# Patient Record
Sex: Female | Born: 1965
Health system: Southern US, Community
[De-identification: ages and names within clinical notes are randomized; demographics above are authoritative.]

## PROBLEM LIST (undated history)

## (undated) DIAGNOSIS — F329 Major depressive disorder, single episode, unspecified: Secondary | ICD-10-CM

## (undated) DIAGNOSIS — Z8619 Personal history of other infectious and parasitic diseases: Secondary | ICD-10-CM

## (undated) DIAGNOSIS — F419 Anxiety disorder, unspecified: Secondary | ICD-10-CM

## (undated) DIAGNOSIS — Z8719 Personal history of other diseases of the digestive system: Secondary | ICD-10-CM

## (undated) DIAGNOSIS — F32A Depression, unspecified: Secondary | ICD-10-CM

## (undated) DIAGNOSIS — E785 Hyperlipidemia, unspecified: Secondary | ICD-10-CM

## (undated) DIAGNOSIS — I10 Essential (primary) hypertension: Secondary | ICD-10-CM

## (undated) DIAGNOSIS — E039 Hypothyroidism, unspecified: Secondary | ICD-10-CM

## (undated) DIAGNOSIS — Z87898 Personal history of other specified conditions: Secondary | ICD-10-CM

## (undated) HISTORY — DX: Personal history of other specified conditions: Z87.898

## (undated) HISTORY — DX: Anxiety disorder, unspecified: F41.9

## (undated) HISTORY — DX: Depression, unspecified: F32.A

## (undated) HISTORY — DX: Major depressive disorder, single episode, unspecified: F32.9

## (undated) HISTORY — DX: Hyperlipidemia, unspecified: E78.5

## (undated) HISTORY — DX: Essential (primary) hypertension: I10

## (undated) HISTORY — DX: Personal history of other infectious and parasitic diseases: Z86.19

## (undated) HISTORY — DX: Hypothyroidism, unspecified: E03.9

## (undated) HISTORY — DX: Personal history of other diseases of the digestive system: Z87.19

---

## 2000-05-14 ENCOUNTER — Other Ambulatory Visit: Admission: RE | Admit: 2000-05-14 | Discharge: 2000-05-14 | Payer: Self-pay | Admitting: Obstetrics and Gynecology

## 2001-03-10 ENCOUNTER — Ambulatory Visit (HOSPITAL_COMMUNITY): Admission: RE | Admit: 2001-03-10 | Discharge: 2001-03-10 | Payer: Self-pay | Admitting: Family Medicine

## 2001-03-14 ENCOUNTER — Encounter: Payer: Self-pay | Admitting: Family Medicine

## 2001-03-14 ENCOUNTER — Ambulatory Visit (HOSPITAL_COMMUNITY): Admission: RE | Admit: 2001-03-14 | Discharge: 2001-03-14 | Payer: Self-pay | Admitting: Family Medicine

## 2001-06-10 ENCOUNTER — Other Ambulatory Visit: Admission: RE | Admit: 2001-06-10 | Discharge: 2001-06-10 | Payer: Self-pay | Admitting: Obstetrics and Gynecology

## 2001-07-11 ENCOUNTER — Encounter: Payer: Self-pay | Admitting: *Deleted

## 2001-07-11 ENCOUNTER — Ambulatory Visit (HOSPITAL_COMMUNITY): Admission: RE | Admit: 2001-07-11 | Discharge: 2001-07-11 | Payer: Self-pay | Admitting: *Deleted

## 2004-10-04 ENCOUNTER — Encounter: Admission: RE | Admit: 2004-10-04 | Discharge: 2004-10-04 | Payer: Self-pay | Admitting: Obstetrics and Gynecology

## 2005-11-01 ENCOUNTER — Encounter: Admission: RE | Admit: 2005-11-01 | Discharge: 2005-11-01 | Payer: Self-pay | Admitting: Obstetrics and Gynecology

## 2006-11-22 ENCOUNTER — Encounter: Admission: RE | Admit: 2006-11-22 | Discharge: 2006-11-22 | Payer: Self-pay | Admitting: Obstetrics and Gynecology

## 2007-12-08 ENCOUNTER — Encounter: Admission: RE | Admit: 2007-12-08 | Discharge: 2007-12-08 | Payer: Self-pay | Admitting: Obstetrics and Gynecology

## 2009-01-04 ENCOUNTER — Encounter: Admission: RE | Admit: 2009-01-04 | Discharge: 2009-01-04 | Payer: Self-pay | Admitting: Obstetrics and Gynecology

## 2010-01-08 HISTORY — PX: ANAL FISSURE REPAIR: SHX2312

## 2010-01-19 ENCOUNTER — Ambulatory Visit (HOSPITAL_BASED_OUTPATIENT_CLINIC_OR_DEPARTMENT_OTHER): Admission: RE | Admit: 2010-01-19 | Discharge: 2010-01-19 | Payer: Self-pay | Admitting: General Surgery

## 2010-11-28 LAB — BASIC METABOLIC PANEL
BUN: 12 mg/dL (ref 6–23)
CO2: 26 mEq/L (ref 19–32)
Calcium: 9.7 mg/dL (ref 8.4–10.5)
Chloride: 102 mEq/L (ref 96–112)
Creatinine, Ser: 0.84 mg/dL (ref 0.4–1.2)
GFR calc Af Amer: >60 mL/min (ref >60)
GFR calc non Af Amer: >60 mL/min (ref >60)
Glucose, Bld: 124 mg/dL — ABNORMAL HIGH (ref 70–99)
Potassium: 4.3 mEq/L (ref 3.5–5.1)
Sodium: 138 mEq/L (ref 135–145)

## 2010-11-28 LAB — CBC
HCT: 38.3 % (ref 36.0–46.0)
Hemoglobin: 12.5 g/dL (ref 12.0–15.0)
MCHC: 32.6 g/dL (ref 30.0–36.0)
MCV: 96 fL (ref 78.0–100.0)
Platelets: 328 10*3/uL (ref 150–400)
RBC: 3.99 MIL/uL (ref 3.87–5.11)
RDW: 15 % (ref 11.5–15.5)
WBC: 10.5 10*3/uL (ref 4.0–10.5)

## 2010-11-28 LAB — DIFFERENTIAL
Basophils Absolute: 0 10*3/uL (ref 0.0–0.1)
Basophils Relative: 0 % (ref 0–1)
Eosinophils Absolute: 0.4 10*3/uL (ref 0.0–0.7)
Eosinophils Relative: 3 % (ref 0–5)
Lymphocytes Relative: 41 % (ref 12–46)
Lymphs Abs: 4.3 10*3/uL — ABNORMAL HIGH (ref 0.7–4.0)
Monocytes Absolute: 0.5 10*3/uL (ref 0.1–1.0)
Monocytes Relative: 4 % (ref 3–12)
Neutro Abs: 5.3 10*3/uL (ref 1.7–7.7)
Neutrophils Relative %: 51 % (ref 43–77)

## 2010-11-28 LAB — POCT PREGNANCY, URINE: Preg Test, Ur: NEGATIVE

## 2010-12-06 ENCOUNTER — Other Ambulatory Visit: Payer: Self-pay | Admitting: Family Medicine

## 2010-12-06 ENCOUNTER — Other Ambulatory Visit (HOSPITAL_COMMUNITY)
Admission: RE | Admit: 2010-12-06 | Discharge: 2010-12-06 | Disposition: A | Discharge status: Home / Self Care | Payer: BC Managed Care – PPO | Source: Ambulatory Visit | Attending: Family Medicine | Admitting: Family Medicine

## 2010-12-06 DIAGNOSIS — Z0000 Encounter for general adult medical examination without abnormal findings: Secondary | ICD-10-CM | POA: Insufficient documentation

## 2011-09-19 ENCOUNTER — Other Ambulatory Visit: Payer: Self-pay | Admitting: Family Medicine

## 2011-09-19 DIAGNOSIS — Z1231 Encounter for screening mammogram for malignant neoplasm of breast: Secondary | ICD-10-CM

## 2011-10-02 ENCOUNTER — Ambulatory Visit
Admission: RE | Admit: 2011-10-02 | Discharge: 2011-10-02 | Disposition: A | Discharge status: Home / Self Care | Payer: BC Managed Care – PPO | Source: Ambulatory Visit | Attending: Family Medicine | Admitting: Family Medicine

## 2011-10-02 DIAGNOSIS — Z1231 Encounter for screening mammogram for malignant neoplasm of breast: Secondary | ICD-10-CM

## 2012-11-27 ENCOUNTER — Other Ambulatory Visit: Payer: Self-pay

## 2012-11-27 DIAGNOSIS — Z1231 Encounter for screening mammogram for malignant neoplasm of breast: Secondary | ICD-10-CM

## 2012-12-08 ENCOUNTER — Ambulatory Visit
Admission: RE | Admit: 2012-12-08 | Discharge: 2012-12-08 | Disposition: A | Discharge status: Home / Self Care | Payer: BC Managed Care – PPO | Source: Ambulatory Visit

## 2012-12-08 DIAGNOSIS — Z1231 Encounter for screening mammogram for malignant neoplasm of breast: Secondary | ICD-10-CM

## 2013-11-18 ENCOUNTER — Other Ambulatory Visit: Payer: Self-pay

## 2013-11-18 DIAGNOSIS — Z1231 Encounter for screening mammogram for malignant neoplasm of breast: Secondary | ICD-10-CM

## 2013-12-15 ENCOUNTER — Ambulatory Visit
Admission: RE | Admit: 2013-12-15 | Discharge: 2013-12-15 | Disposition: A | Discharge status: Home / Self Care | Payer: BC Managed Care – PPO | Source: Ambulatory Visit

## 2013-12-15 DIAGNOSIS — Z1231 Encounter for screening mammogram for malignant neoplasm of breast: Secondary | ICD-10-CM

## 2014-08-26 ENCOUNTER — Other Ambulatory Visit: Payer: Self-pay | Admitting: Family Medicine

## 2014-08-26 ENCOUNTER — Other Ambulatory Visit (HOSPITAL_COMMUNITY)
Admission: RE | Admit: 2014-08-26 | Discharge: 2014-08-26 | Disposition: A | Discharge status: Home / Self Care | Payer: BC Managed Care – PPO | Source: Ambulatory Visit | Attending: Family Medicine | Admitting: Family Medicine

## 2014-08-26 DIAGNOSIS — Z01419 Encounter for gynecological examination (general) (routine) without abnormal findings: Secondary | ICD-10-CM | POA: Insufficient documentation

## 2014-08-27 LAB — CYTOLOGY - PAP

## 2014-10-05 ENCOUNTER — Ambulatory Visit (INDEPENDENT_AMBULATORY_CARE_PROVIDER_SITE_OTHER): Payer: BLUE CROSS/BLUE SHIELD | Admitting: Podiatry

## 2014-10-05 ENCOUNTER — Encounter: Payer: Self-pay | Admitting: Podiatry

## 2014-10-05 VITALS — BP 136/73 | HR 85 | Ht 64.5 in | Wt 173.0 lb

## 2014-10-05 DIAGNOSIS — M21969 Unspecified acquired deformity of unspecified lower leg: Secondary | ICD-10-CM

## 2014-10-05 DIAGNOSIS — M722 Plantar fascial fibromatosis: Secondary | ICD-10-CM

## 2014-10-05 MED ORDER — NABUMETONE 500 MG PO TABS: 500.0000 mg | ORAL_TABLET | Freq: Two times a day (BID) | ORAL | Status: DC

## 2014-10-05 NOTE — Patient Instructions (Signed)
Seen for left heel pain. Injection given. Return for Custom orthotics. Take Relafen as needed.

## 2014-10-05 NOTE — Progress Notes (Signed)
Subjective: 49 year old female presents with a history of plantar fasciitis 10 years ago on left. Now she is getting another episode on her left heel. She has been doing stretch exercise regularly without any success reducing heel pain. Pain is still in bottom of heel. On feet up and down 4-5 hours a day. Her left shoe heel has worn out at lateral side more than the right side.  Objective: Dermatologic: Normal skin without abnormal lesions. Vascular: All pedal pulses are palpable. No edema or erythema noted. Neurologic: All epicritic and tactile sensations grossly intact. Orthopedic: Elevated first ray L>R. Rigid cavus type foot bilateral.  Assessment: Plantar fasciitis left heel. 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  Plan:  Reviewed findings and available options and shoe selections. Continue with stretch exercise.  Left plantar heel injected with mixture of 4 mg Dexamethasone, 4 mg Triamcinolone, and 1 cc of 0.5% Marcaine plain. Patient tolerated well without difficulty.  Rx. Relafen.  Patient will return for custom orthotics.

## 2014-10-07 ENCOUNTER — Telehealth: Payer: Self-pay | Admitting: *Deleted

## 2014-10-07 NOTE — Telephone Encounter (Signed)
10/07/14 Audrey Rogers, Patient called today. She ask can we send a note to her Audrey Rogers,Audrey Rogers saying she needs to wear tennis shoes for her foot issues. Dr. Raynald KempSheard told her she needed to wear them. You can fax the note to [phone]

## 2014-10-07 NOTE — Telephone Encounter (Signed)
10/07/14 Dr. Raynald KempSheard, Patient called this am. She was in the office on 10/05/14 and was given a cortisone injection, several hours later she got real hot in her face and redness and after she got home redness some on her chest too. Just a FYI. Thanks

## 2014-10-12 ENCOUNTER — Encounter: Payer: Self-pay | Admitting: Podiatry

## 2014-10-12 ENCOUNTER — Ambulatory Visit (INDEPENDENT_AMBULATORY_CARE_PROVIDER_SITE_OTHER): Payer: BLUE CROSS/BLUE SHIELD | Admitting: Podiatry

## 2014-10-12 VITALS — BP 151/86 | HR 97 | Ht 64.5 in | Wt 172.0 lb

## 2014-10-12 DIAGNOSIS — M722 Plantar fascial fibromatosis: Secondary | ICD-10-CM

## 2014-10-12 DIAGNOSIS — M21969 Unspecified acquired deformity of unspecified lower leg: Secondary | ICD-10-CM

## 2014-10-12 NOTE — Patient Instructions (Signed)
Both feet casted for orthotics. Continue with stretch exercise.  

## 2014-10-12 NOTE — Progress Notes (Signed)
Subjective: 49 year old female presents to prepare for custom orthotics.  After she had cortisone injection in left heel she took Relafen that night. The next day she got red hot flesh on her face. She discontinued Relafen and resumed Aleve.  Pain is not as bad as before the injection, but still has some residual pain in bottom of left heel.  Objective: Dermatologic: Normal skin without abnormal lesions. Vascular: All pedal pulses are palpable. No edema or erythema noted. Neurologic: All epicritic and tactile sensations grossly intact. Orthopedic: Elevated first ray L>R. Rigid cavus type foot bilateral. Radiographic examination reveal cavus type foot with elevated first ray bilateral, positive for plantar calcaneal spur on left. Minimum change in lateral deviation angle of Calcaneocuboid angle bilateral.  Assessment: Plantar fasciitis left heel. 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  Plan:  Continue with stretch exercise.  Both feet casted for orthotics.

## 2014-10-15 DIAGNOSIS — Z8719 Personal history of other diseases of the digestive system: Secondary | ICD-10-CM

## 2014-10-15 HISTORY — PX: COLONOSCOPY: SHX174

## 2014-10-15 HISTORY — DX: Personal history of other diseases of the digestive system: Z87.19

## 2014-12-01 ENCOUNTER — Encounter: Payer: Self-pay | Admitting: Podiatry

## 2014-12-01 ENCOUNTER — Ambulatory Visit (INDEPENDENT_AMBULATORY_CARE_PROVIDER_SITE_OTHER): Payer: BLUE CROSS/BLUE SHIELD | Admitting: Podiatry

## 2014-12-01 DIAGNOSIS — M722 Plantar fascial fibromatosis: Secondary | ICD-10-CM

## 2014-12-01 NOTE — Progress Notes (Signed)
Pain with orthotics. Got more throbbing pain at the end of the day. On feet a lot at work.  Last injection did not help much. Pain is at center of left plantar heel.  Dx. Plantar fasciitis left. Elevated first ray bilateral.  Plan: Will adjust orthotics; take off heel lift and add more cushion.

## 2014-12-01 NOTE — Patient Instructions (Signed)
Pain with orthotics. Will adjust. Take off lift and add more cushion on heel.

## 2015-03-01 LAB — TSH: TSH: 2.77 (ref 0.41–5.90)

## 2015-11-28 LAB — TSH: TSH: 4.11 (ref 0.41–5.90)

## 2016-01-25 DIAGNOSIS — R946 Abnormal results of thyroid function studies: Secondary | ICD-10-CM | POA: Diagnosis not present

## 2016-05-31 DIAGNOSIS — R030 Elevated blood-pressure reading, without diagnosis of hypertension: Secondary | ICD-10-CM | POA: Diagnosis not present

## 2016-05-31 DIAGNOSIS — E782 Mixed hyperlipidemia: Secondary | ICD-10-CM | POA: Diagnosis not present

## 2016-05-31 DIAGNOSIS — F419 Anxiety disorder, unspecified: Secondary | ICD-10-CM | POA: Diagnosis not present

## 2016-05-31 DIAGNOSIS — E039 Hypothyroidism, unspecified: Secondary | ICD-10-CM | POA: Diagnosis not present

## 2016-05-31 DIAGNOSIS — Z23 Encounter for immunization: Secondary | ICD-10-CM | POA: Diagnosis not present

## 2016-05-31 LAB — BASIC METABOLIC PANEL
BUN: 14 (ref 4–21)
Creatinine: 0.7 (ref 0.5–1.1)
Glucose: 138
Potassium: 4.3 (ref 3.4–5.3)
Sodium: 138 (ref 137–147)

## 2016-05-31 LAB — LIPID PANEL
Cholesterol: 198 (ref 0–200)
HDL: 54 (ref 35–70)
LDL Cholesterol: 100
LDl/HDL Ratio: 3.7
Triglycerides: 218 — AB (ref 40–160)

## 2016-05-31 LAB — HEPATIC FUNCTION PANEL
ALT: 13 (ref 7–35)
AST: 15 (ref 13–35)
Alkaline Phosphatase: 1.4 — AB (ref 25–125)
Bilirubin, Total: 0.3

## 2016-11-14 DIAGNOSIS — S161XXA Strain of muscle, fascia and tendon at neck level, initial encounter: Secondary | ICD-10-CM | POA: Diagnosis not present

## 2016-11-14 DIAGNOSIS — S39012A Strain of muscle, fascia and tendon of lower back, initial encounter: Secondary | ICD-10-CM | POA: Diagnosis not present

## 2016-11-14 DIAGNOSIS — S0003XA Contusion of scalp, initial encounter: Secondary | ICD-10-CM | POA: Diagnosis not present

## 2016-11-26 DIAGNOSIS — R030 Elevated blood-pressure reading, without diagnosis of hypertension: Secondary | ICD-10-CM | POA: Diagnosis not present

## 2016-11-26 DIAGNOSIS — S93402A Sprain of unspecified ligament of left ankle, initial encounter: Secondary | ICD-10-CM | POA: Diagnosis not present

## 2016-12-10 DIAGNOSIS — M25572 Pain in left ankle and joints of left foot: Secondary | ICD-10-CM | POA: Diagnosis not present

## 2016-12-27 DIAGNOSIS — M25572 Pain in left ankle and joints of left foot: Secondary | ICD-10-CM | POA: Diagnosis not present

## 2017-01-02 DIAGNOSIS — E782 Mixed hyperlipidemia: Secondary | ICD-10-CM | POA: Diagnosis not present

## 2017-01-02 DIAGNOSIS — E039 Hypothyroidism, unspecified: Secondary | ICD-10-CM | POA: Diagnosis not present

## 2017-01-02 DIAGNOSIS — Z0000 Encounter for general adult medical examination without abnormal findings: Secondary | ICD-10-CM | POA: Diagnosis not present

## 2017-01-03 LAB — CBC AND DIFFERENTIAL
HCT: 37 (ref 36–46)
Hemoglobin: 12.7 (ref 12.0–16.0)
Platelets: 302 (ref 150–399)
WBC: 10.1

## 2017-01-03 LAB — HEPATIC FUNCTION PANEL
ALT: 14 (ref 7–35)
AST: 18 (ref 13–35)
Alkaline Phosphatase: 108 (ref 25–125)
Bilirubin, Total: 0.4

## 2017-01-03 LAB — BASIC METABOLIC PANEL
BUN: 14 (ref 4–21)
Creatinine: 0.7 (ref 0.5–1.1)
Glucose: 89
Sodium: 138 (ref 137–147)

## 2017-01-03 LAB — TSH: TSH: 2.22 (ref 0.41–5.90)

## 2017-01-03 LAB — LIPID PANEL
Cholesterol: 230 — AB (ref 0–200)
HDL: 51 (ref 35–70)
LDL Cholesterol: 143
LDl/HDL Ratio: 4.5
Triglycerides: 180 — AB (ref 40–160)

## 2017-02-19 ENCOUNTER — Other Ambulatory Visit: Payer: Self-pay | Admitting: Family Medicine

## 2017-02-19 DIAGNOSIS — Z1231 Encounter for screening mammogram for malignant neoplasm of breast: Secondary | ICD-10-CM

## 2017-02-21 ENCOUNTER — Ambulatory Visit
Admission: RE | Admit: 2017-02-21 | Discharge: 2017-02-21 | Disposition: A | Discharge status: Home / Self Care | Payer: BLUE CROSS/BLUE SHIELD | Source: Ambulatory Visit | Attending: Family Medicine | Admitting: Family Medicine

## 2017-02-21 DIAGNOSIS — Z1231 Encounter for screening mammogram for malignant neoplasm of breast: Secondary | ICD-10-CM

## 2017-03-19 DIAGNOSIS — H524 Presbyopia: Secondary | ICD-10-CM | POA: Diagnosis not present

## 2017-07-04 DIAGNOSIS — F419 Anxiety disorder, unspecified: Secondary | ICD-10-CM | POA: Diagnosis not present

## 2017-07-04 DIAGNOSIS — E782 Mixed hyperlipidemia: Secondary | ICD-10-CM | POA: Diagnosis not present

## 2017-07-04 DIAGNOSIS — E039 Hypothyroidism, unspecified: Secondary | ICD-10-CM | POA: Diagnosis not present

## 2017-07-04 DIAGNOSIS — Z23 Encounter for immunization: Secondary | ICD-10-CM | POA: Diagnosis not present

## 2018-01-09 ENCOUNTER — Other Ambulatory Visit (HOSPITAL_COMMUNITY)
Admission: RE | Admit: 2018-01-09 | Discharge: 2018-01-09 | Disposition: A | Discharge status: Home / Self Care | Payer: BLUE CROSS/BLUE SHIELD | Source: Ambulatory Visit | Attending: Family Medicine | Admitting: Family Medicine

## 2018-01-09 ENCOUNTER — Other Ambulatory Visit: Payer: Self-pay | Admitting: Family Medicine

## 2018-01-09 DIAGNOSIS — E782 Mixed hyperlipidemia: Secondary | ICD-10-CM | POA: Diagnosis not present

## 2018-01-09 DIAGNOSIS — Z124 Encounter for screening for malignant neoplasm of cervix: Secondary | ICD-10-CM | POA: Insufficient documentation

## 2018-01-09 DIAGNOSIS — F419 Anxiety disorder, unspecified: Secondary | ICD-10-CM | POA: Diagnosis not present

## 2018-01-09 DIAGNOSIS — Z0000 Encounter for general adult medical examination without abnormal findings: Secondary | ICD-10-CM | POA: Diagnosis not present

## 2018-01-09 DIAGNOSIS — E039 Hypothyroidism, unspecified: Secondary | ICD-10-CM | POA: Diagnosis not present

## 2018-01-09 LAB — HEPATIC FUNCTION PANEL
ALT: 11 (ref 7–35)
AST: 13 (ref 13–35)
Alkaline Phosphatase: 82 (ref 25–125)
Bilirubin, Total: 0.4

## 2018-01-09 LAB — BASIC METABOLIC PANEL
BUN: 17 (ref 4–21)
Creatinine: 0.7 (ref 0.5–1.1)
Glucose: 89
Potassium: 4.2 (ref 3.4–5.3)
Sodium: 138 (ref 137–147)

## 2018-01-09 LAB — CBC AND DIFFERENTIAL
HCT: 38 (ref 36–46)
Hemoglobin: 12.7 (ref 12.0–16.0)
Platelets: 287 (ref 150–399)
WBC: 8.8

## 2018-01-09 LAB — LIPID PANEL
Cholesterol: 212 — AB (ref 0–200)
HDL: 54 (ref 35–70)
LDL Cholesterol: 127
LDl/HDL Ratio: 3.9
Triglycerides: 153 (ref 40–160)

## 2018-01-09 LAB — TSH: TSH: 4.76 (ref 0.41–5.90)

## 2018-01-10 LAB — CYTOLOGY - PAP
Diagnosis: NEGATIVE
HPV: NOT DETECTED

## 2018-03-07 DIAGNOSIS — E039 Hypothyroidism, unspecified: Secondary | ICD-10-CM | POA: Diagnosis not present

## 2018-05-26 DIAGNOSIS — Z23 Encounter for immunization: Secondary | ICD-10-CM | POA: Diagnosis not present

## 2018-06-05 ENCOUNTER — Ambulatory Visit: Payer: BLUE CROSS/BLUE SHIELD | Admitting: Family Medicine

## 2018-06-18 ENCOUNTER — Ambulatory Visit: Payer: BLUE CROSS/BLUE SHIELD | Admitting: Family Medicine

## 2018-06-18 ENCOUNTER — Encounter: Payer: Self-pay | Admitting: Family Medicine

## 2018-06-18 VITALS — BP 116/78 | HR 84 | Temp 97.9°F | Ht 65.0 in | Wt 190.8 lb

## 2018-06-18 DIAGNOSIS — E039 Hypothyroidism, unspecified: Secondary | ICD-10-CM | POA: Diagnosis not present

## 2018-06-18 DIAGNOSIS — R0683 Snoring: Secondary | ICD-10-CM | POA: Insufficient documentation

## 2018-06-18 DIAGNOSIS — G4700 Insomnia, unspecified: Secondary | ICD-10-CM | POA: Diagnosis not present

## 2018-06-18 DIAGNOSIS — F329 Major depressive disorder, single episode, unspecified: Secondary | ICD-10-CM

## 2018-06-18 DIAGNOSIS — E785 Hyperlipidemia, unspecified: Secondary | ICD-10-CM | POA: Diagnosis not present

## 2018-06-18 DIAGNOSIS — F419 Anxiety disorder, unspecified: Secondary | ICD-10-CM

## 2018-06-18 DIAGNOSIS — F32A Depression, unspecified: Secondary | ICD-10-CM | POA: Insufficient documentation

## 2018-06-18 LAB — LIPID PANEL
Cholesterol: 210 mg/dL — ABNORMAL HIGH (ref 0–200)
HDL: 53.2 mg/dL (ref >39.00)
LDL Cholesterol: 123 mg/dL — ABNORMAL HIGH (ref 0–99)
NonHDL: 156.61
Total CHOL/HDL Ratio: 4
Triglycerides: 170 mg/dL — ABNORMAL HIGH (ref 0.0–149.0)
VLDL: 34 mg/dL (ref 0.0–40.0)

## 2018-06-18 LAB — COMPREHENSIVE METABOLIC PANEL
ALT: 16 U/L (ref 0–35)
AST: 15 U/L (ref 0–37)
Albumin: 4.4 g/dL (ref 3.5–5.2)
Alkaline Phosphatase: 79 U/L (ref 39–117)
BUN: 16 mg/dL (ref 6–23)
CO2: 30 mEq/L (ref 19–32)
Calcium: 10 mg/dL (ref 8.4–10.5)
Chloride: 103 mEq/L (ref 96–112)
Creatinine, Ser: 0.72 mg/dL (ref 0.40–1.20)
GFR: 89.75 mL/min (ref >60.00)
Glucose, Bld: 79 mg/dL (ref 70–99)
Potassium: 4.3 mEq/L (ref 3.5–5.1)
Sodium: 138 mEq/L (ref 135–145)
Total Bilirubin: 0.3 mg/dL (ref 0.2–1.2)
Total Protein: 7.9 g/dL (ref 6.0–8.3)

## 2018-06-18 LAB — T4, FREE: Free T4: 0.86 ng/dL (ref 0.60–1.60)

## 2018-06-18 LAB — TSH: TSH: 1.53 u[IU]/mL (ref 0.35–4.50)

## 2018-06-18 MED ORDER — TRAZODONE HCL 50 MG PO TABS: 25.0000 mg | ORAL_TABLET | Freq: Every evening | ORAL | 3 refills | Status: AC | PRN

## 2018-06-18 NOTE — Assessment & Plan Note (Addendum)
>  30 min spent with patient, at least half of which was spent on counseling insomnia.  The problem of recurrent insomnia is discussed. Avoidance of caffeine sources is strongly encouraged. Sleep hygiene issues are reviewed.  We are starting trazodone 25- 50 mg nightly. She will update me in a few weeks.

## 2018-06-18 NOTE — Progress Notes (Addendum)
Subjective:   Patient ID: Audrey Rogers, female    DOB: [birth year], 52 y.o.   MRN: [id number]  DONNAMAE MUILENBURG is a pleasant 52 y.o. year old female who presents to clinic today with New Patient (Initial Visit) (Patient is here today to establish care.  She is not currently fasting.  She had flu shot at [employer].  She requested her records to be sent to [place] from her previous provider.  She takes Synthroid , Sertraline 50mg , and Alprazolam 0.5mg .  She is compliant with current regimen but feels that she would like to discuss the Sertraline.  )  on 06/18/2018  HPI:  Establishing care- new to me.  Hypothyroidism- currently taking synthroid 88 mcg daily. Denies any symptoms of hypo or hyperthyroidism. No results found for: TSH  Anxiety/depression- Takes zoloft 50 mg daily with as needed xanax for the past 5 years.  Rarely takes xanax.  Insomnia- has deteriorated recently.  Feeling more overwhelmed at work. Tylenol PM has helped but not much.  Has been told she is snoring and gasping for breath lately.  Current Outpatient Medications on File Prior to Visit  Medication Sig Dispense Refill  . ALPRAZolam (XANAX) 0.5 MG tablet   0  . sertraline (ZOLOFT) 50 MG tablet   0  . SYNTHROID 88 MCG tablet Take 1 tablet by mouth daily.  1   No current facility-administered medications on file prior to visit.     Allergies  Allergen Reactions  . Sulfa Antibiotics     No past medical history on file.  No past surgical history on file.  No family history on file.  Social History   Socioeconomic History  . Marital status: Married    Spouse name: Not on file  . Number of children: Not on file  . Years of education: Not on file  . Highest education level: Not on file  Occupational History  . Not on file  Social Needs  . Financial resource strain: Not on file  . Food insecurity:    Worry: Not on file    Inability: Not on file  . Transportation needs:    Medical: Not  on file    Non-medical: Not on file  Tobacco Use  . Smoking status: Never Smoker  . Smokeless tobacco: Never Used  Substance and Sexual Activity  . Alcohol use: Not on file  . Drug use: Not on file  . Sexual activity: Not on file  Lifestyle  . Physical activity:    Days per week: Not on file    Minutes per session: Not on file  . Stress: Not on file  Relationships  . Social connections:    Talks on phone: Not on file    Gets together: Not on file    Attends religious service: Not on file    Active member of club or organization: Not on file    Attends meetings of clubs or organizations: Not on file    Relationship status: Not on file  . Intimate partner violence:    Fear of current or ex partner: Not on file    Emotionally abused: Not on file    Physically abused: Not on file    Forced sexual activity: Not on file  Other Topics Concern  . Not on file  Social History Narrative  . Not on file   The PMH, PSH, Social History, Family History, Medications, and allergies have been reviewed in [facility], and have been updated if relevant.  Current Outpatient Medications on File Prior to Visit  Medication Sig Dispense Refill  . ALPRAZolam (XANAX) 0.5 MG tablet   0  . sertraline (ZOLOFT) 50 MG tablet   0  . SYNTHROID 88 MCG tablet Take 1 tablet by mouth daily.  1   No current facility-administered medications on file prior to visit.     Allergies  Allergen Reactions  . Sulfa Antibiotics     No past medical history on file.  No past surgical history on file.  No family history on file.  Social History   Socioeconomic History  . Marital status: Married    Spouse name: Not on file  . Number of children: Not on file  . Years of education: Not on file  . Highest education level: Not on file  Occupational History  . Not on file  Social Needs  . Financial resource strain: Not on file  . Food insecurity:    Worry: Not on file    Inability: Not on file  . Transportation  needs:    Medical: Not on file    Non-medical: Not on file  Tobacco Use  . Smoking status: Never Smoker  . Smokeless tobacco: Never Used  Substance and Sexual Activity  . Alcohol use: Not on file  . Drug use: Not on file  . Sexual activity: Not on file  Lifestyle  . Physical activity:    Days per week: Not on file    Minutes per session: Not on file  . Stress: Not on file  Relationships  . Social connections:    Talks on phone: Not on file    Gets together: Not on file    Attends religious service: Not on file    Active member of club or organization: Not on file    Attends meetings of clubs or organizations: Not on file    Relationship status: Not on file  . Intimate partner violence:    Fear of current or ex partner: Not on file    Emotionally abused: Not on file    Physically abused: Not on file    Forced sexual activity: Not on file  Other Topics Concern  . Not on file  Social History Narrative  . Not on file   The PMH, PSH, Social History, Family History, Medications, and allergies have been reviewed in [facility], and have been updated if relevant.   Review of Systems  Constitutional: Positive for fatigue.  HENT: Negative.   Eyes: Negative.   Respiratory: Negative.   Cardiovascular: Negative.   Gastrointestinal: Negative.   Endocrine: Negative.   Genitourinary: Negative.   Musculoskeletal: Negative.   Skin: Negative.   Allergic/Immunologic: Negative.   Neurological: Negative.   Hematological: Negative.   Psychiatric/Behavioral: Positive for sleep disturbance. Negative for agitation, behavioral problems, confusion, decreased concentration, dysphoric mood, hallucinations, self-injury and suicidal ideas. The patient is nervous/anxious. The patient is not hyperactive.   All other systems reviewed and are negative.      Objective:    BP 116/78 (BP Location: Left Arm, Patient Position: Sitting, Cuff Size: Normal)   Pulse 84   Temp 97.9 F (36.6 C) (Oral)   Ht 5'  5" (1.651 m)   Wt 190 lb 12.8 oz (86.5 kg)   SpO2 97%   BMI 31.75 kg/m    Physical Exam  Constitutional: She is oriented to person, place, and time. She appears well-developed and well-nourished. No distress.  HENT:  Head: Normocephalic and atraumatic.  Eyes: EOM are  normal.  Neck: Normal range of motion.  Cardiovascular: Normal rate and regular rhythm.  Pulmonary/Chest: Effort normal and breath sounds normal.  Abdominal: Soft. Bowel sounds are normal.  Musculoskeletal: Normal range of motion.  Neurological: She is alert and oriented to person, place, and time. No cranial nerve deficit.  Skin: Skin is warm and dry. She is not diaphoretic.  Psychiatric: She has a normal mood and affect. Her behavior is normal. Judgment and thought content normal.  Nursing note and vitals reviewed.         Assessment & Plan:   Anxiety and depression  Hypothyroidism, unspecified type No follow-ups on file.

## 2018-06-18 NOTE — Assessment & Plan Note (Addendum)
Increased fatigue- recheck thyroid panel today. Continue synthroid at current dose.

## 2018-06-18 NOTE — Patient Instructions (Signed)
Great to see you. I will call you with your lab results from today and you can view them online.   We are starting Trazodone 25- 50 mg nightly for insomnia.  We are referring you to a lung doctor for a sleep study.

## 2018-06-18 NOTE — Assessment & Plan Note (Signed)
?   Sleep apnea. Refer to pulmonary for sleep study.

## 2018-06-18 NOTE — Assessment & Plan Note (Signed)
Repeat lipid panel today. 

## 2018-06-18 NOTE — Assessment & Plan Note (Signed)
Continue current dose of zoloft and as needed xanax for now.

## 2018-06-30 ENCOUNTER — Other Ambulatory Visit: Payer: Self-pay | Admitting: Family Medicine

## 2018-06-30 DIAGNOSIS — Z1231 Encounter for screening mammogram for malignant neoplasm of breast: Secondary | ICD-10-CM

## 2018-07-10 ENCOUNTER — Ambulatory Visit: Payer: BLUE CROSS/BLUE SHIELD | Admitting: Nurse Practitioner

## 2018-07-10 ENCOUNTER — Encounter: Payer: Self-pay | Admitting: Nurse Practitioner

## 2018-07-10 VITALS — BP 132/84 | HR 93 | Temp 97.8°F | Ht 65.0 in | Wt 192.0 lb

## 2018-07-10 DIAGNOSIS — J040 Acute laryngitis: Secondary | ICD-10-CM

## 2018-07-10 DIAGNOSIS — J209 Acute bronchitis, unspecified: Secondary | ICD-10-CM | POA: Diagnosis not present

## 2018-07-10 DIAGNOSIS — J0100 Acute maxillary sinusitis, unspecified: Secondary | ICD-10-CM

## 2018-07-10 MED ORDER — AZITHROMYCIN 250 MG PO TABS: 250.0000 mg | ORAL_TABLET | Freq: Every day | ORAL | 0 refills | Status: DC

## 2018-07-10 MED ORDER — METHYLPREDNISOLONE ACETATE 40 MG/ML IJ SUSP
40.0000 mg | Freq: Once | INTRAMUSCULAR | Status: AC
Start: 2018-07-10 — End: 2018-07-10
  Administered 2018-07-10: 40 mg via INTRAMUSCULAR

## 2018-07-10 MED ORDER — GUAIFENESIN-DM 100-10 MG/5ML PO SYRP: 5.0000 mL | ORAL_SOLUTION | ORAL | 0 refills | Status: DC | PRN

## 2018-07-10 MED ORDER — ALBUTEROL SULFATE HFA 108 (90 BASE) MCG/ACT IN AERS: 1.0000 | INHALATION_SPRAY | Freq: Four times a day (QID) | RESPIRATORY_TRACT | 0 refills | Status: DC | PRN

## 2018-07-10 MED ORDER — HYDROCODONE-HOMATROPINE 5-1.5 MG/5ML PO SYRP: 5.0000 mL | ORAL_SOLUTION | Freq: Three times a day (TID) | ORAL | 0 refills | Status: DC | PRN

## 2018-07-10 NOTE — Progress Notes (Signed)
Subjective:  Patient ID: Audrey Rogers, female    DOB: Aug 16, 1966  Age: 52 y.o. MRN: [id number]  CC: Cough (patient is complaining of coughing thick mucus,unable to sleep,painful on left side going toward the back,tingling on fingers left side. going on 1 wk. took advil cold and sinus and robitussion CF. )  Cough  This is a new problem. The current episode started in the past 7 days. The problem has been unchanged. The problem occurs constantly. The cough is non-productive. Associated symptoms include chest pain, myalgias, nasal congestion, postnasal drip and shortness of breath. Pertinent negatives include no chills, fever, headaches, heartburn, rash, rhinorrhea, sore throat, sweats or wheezing. The symptoms are aggravated by lying down. She has tried OTC cough suppressant and rest for the symptoms. The treatment provided no relief.   Reviewed past Medical, Social and Family history today.  Outpatient Medications Prior to Visit  Medication Sig Dispense Refill  . ALPRAZolam (XANAX) 0.5 MG tablet   0  . sertraline (ZOLOFT) 50 MG tablet   0  . SYNTHROID 88 MCG tablet Take 1 tablet by mouth daily.  1  . traZODone (DESYREL) 50 MG tablet Take 0.5-1 tablets (25-50 mg total) by mouth at bedtime as needed for sleep. 30 tablet 3   No facility-administered medications prior to visit.     ROS See HPI  Objective:  BP 132/84   Pulse 93   Temp 97.8 F (36.6 C) (Oral)   Ht 5\' 5"  (1.651 m)   Wt 192 lb (87.1 kg)   SpO2 98%   BMI 31.95 kg/m   BP Readings from Last 3 Encounters:  07/10/18 132/84  06/18/18 116/78  10/12/14 (!) 151/86    Wt Readings from Last 3 Encounters:  07/10/18 192 lb (87.1 kg)  06/18/18 190 lb 12.8 oz (86.5 kg)  10/12/14 172 lb (78 kg)    Physical Exam  Constitutional: She is oriented to person, place, and time.  HENT:  Right Ear: Tympanic membrane, external ear and ear canal normal.  Left Ear: Tympanic membrane, external ear and ear canal normal.  Nose:  Mucosal edema and rhinorrhea present. Right sinus exhibits maxillary sinus tenderness. Right sinus exhibits no frontal sinus tenderness. Left sinus exhibits maxillary sinus tenderness. Left sinus exhibits no frontal sinus tenderness.  Mouth/Throat: Uvula is midline. No trismus in the jaw. Posterior oropharyngeal erythema present. No oropharyngeal exudate, posterior oropharyngeal edema or tonsillar abscesses. Tonsils are 1+ on the right. Tonsils are 1+ on the left.  Eyes: No scleral icterus.  Neck: Normal range of motion. Neck supple.  Cardiovascular: Normal rate and normal heart sounds.  Pulmonary/Chest: Effort normal and breath sounds normal. She has no wheezes. She exhibits no tenderness.  Musculoskeletal: She exhibits no edema.  Lymphadenopathy:    She has no cervical adenopathy.  Neurological: She is alert and oriented to person, place, and time.  Vitals reviewed.   Lab Results  Component Value Date   WBC 10.5 01/16/2010   HGB 12.5 01/16/2010   HCT 38.3 01/16/2010   PLT 328 01/16/2010   GLUCOSE 79 06/18/2018   CHOL 210 (H) 06/18/2018   TRIG 170.0 (H) 06/18/2018   HDL 53.20 06/18/2018   LDLCALC 123 (H) 06/18/2018   ALT 16 06/18/2018   AST 15 06/18/2018   NA 138 06/18/2018   K 4.3 06/18/2018   CL 103 06/18/2018   CREATININE 0.72 06/18/2018   BUN 16 06/18/2018   CO2 30 06/18/2018   TSH 1.53 06/18/2018    Assessment &  Plan:   Lorana was seen today for cough.  Diagnoses and all orders for this visit:  Acute non-recurrent maxillary sinusitis -     methylPREDNISolone acetate (DEPO-MEDROL) injection 40 mg -     guaiFENesin-dextromethorphan (ROBITUSSIN DM) 100-10 MG/5ML syrup; Take 5 mLs by mouth every 4 (four) hours as needed for cough. -     azithromycin (ZITHROMAX Z-PAK) 250 MG tablet; Take 1 tablet (250 mg total) by mouth daily. Take 2tabs on first day, then 1tab once a day till complete -     HYDROcodone-homatropine (HYCODAN) 5-1.5 MG/5ML syrup; Take 5 mLs by mouth every 8  (eight) hours as needed for cough. -     albuterol (PROVENTIL HFA;VENTOLIN HFA) 108 (90 Base) MCG/ACT inhaler; Inhale 1-2 puffs into the lungs every 6 (six) hours as needed.  Acute bronchitis, unspecified organism -     methylPREDNISolone acetate (DEPO-MEDROL) injection 40 mg -     guaiFENesin-dextromethorphan (ROBITUSSIN DM) 100-10 MG/5ML syrup; Take 5 mLs by mouth every 4 (four) hours as needed for cough. -     azithromycin (ZITHROMAX Z-PAK) 250 MG tablet; Take 1 tablet (250 mg total) by mouth daily. Take 2tabs on first day, then 1tab once a day till complete -     HYDROcodone-homatropine (HYCODAN) 5-1.5 MG/5ML syrup; Take 5 mLs by mouth every 8 (eight) hours as needed for cough. -     albuterol (PROVENTIL HFA;VENTOLIN HFA) 108 (90 Base) MCG/ACT inhaler; Inhale 1-2 puffs into the lungs every 6 (six) hours as needed.  Acute laryngitis -     methylPREDNISolone acetate (DEPO-MEDROL) injection 40 mg -     guaiFENesin-dextromethorphan (ROBITUSSIN DM) 100-10 MG/5ML syrup; Take 5 mLs by mouth every 4 (four) hours as needed for cough. -     azithromycin (ZITHROMAX Z-PAK) 250 MG tablet; Take 1 tablet (250 mg total) by mouth daily. Take 2tabs on first day, then 1tab once a day till complete -     HYDROcodone-homatropine (HYCODAN) 5-1.5 MG/5ML syrup; Take 5 mLs by mouth every 8 (eight) hours as needed for cough. -     albuterol (PROVENTIL HFA;VENTOLIN HFA) 108 (90 Base) MCG/ACT inhaler; Inhale 1-2 puffs into the lungs every 6 (six) hours as needed.   I am having Daley L. Lalla Brothers start on guaiFENesin-dextromethorphan, azithromycin, HYDROcodone-homatropine, and albuterol. I am also having her maintain her ALPRAZolam, sertraline, SYNTHROID, and traZODone. We will continue to administer methylPREDNISolone acetate.  Meds ordered this encounter  Medications  . methylPREDNISolone acetate (DEPO-MEDROL) injection 40 mg  . guaiFENesin-dextromethorphan (ROBITUSSIN DM) 100-10 MG/5ML syrup    Sig: Take 5 mLs by  mouth every 4 (four) hours as needed for cough.    Dispense:  118 mL    Refill:  0    Order Specific Question:   Supervising Provider    Answer:   Dianne Dun [[id number]]  . azithromycin (ZITHROMAX Z-PAK) 250 MG tablet    Sig: Take 1 tablet (250 mg total) by mouth daily. Take 2tabs on first day, then 1tab once a day till complete    Dispense:  6 tablet    Refill:  0    Order Specific Question:   Supervising Provider    Answer:   Dianne Dun [[id number]]  . HYDROcodone-homatropine (HYCODAN) 5-1.5 MG/5ML syrup    Sig: Take 5 mLs by mouth every 8 (eight) hours as needed for cough.    Dispense:  100 mL    Refill:  0    Order Specific Question:   Supervising Provider  Answer:   Dianne Dun [[id number]]  . albuterol (PROVENTIL HFA;VENTOLIN HFA) 108 (90 Base) MCG/ACT inhaler    Sig: Inhale 1-2 puffs into the lungs every 6 (six) hours as needed.    Dispense:  1 Inhaler    Refill:  0    Order Specific Question:   Supervising Provider    Answer:   Dianne Dun [[id number]]   Follow-up: Return if symptoms worsen or fail to improve.  Alysia Penna, NP

## 2018-07-10 NOTE — Patient Instructions (Signed)
Laryngitis Laryngitis is swelling (inflammation) of your vocal cords. This causes hoarseness, coughing, loss of voice, sore throat, or a dry throat. When your vocal cords are inflamed, your voice sounds different. Laryngitis can be temporary (acute) or long-term (chronic). Most cases of acute laryngitis improve with time. Chronic laryngitis is laryngitis that lasts for more than three weeks. Follow these instructions at home:  Drink enough fluid to keep your pee (urine) clear or pale yellow.  Breathe in moist air. Use a humidifier if you live in a dry climate.  Take medicines only as told by your doctor.  Do not smoke cigarettes or electronic cigarettes. If you need help quitting, ask your doctor.  Talk as little as possible. Also avoid whispering, which can cause vocal strain.  Write instead of talking. Do this until your voice is back to normal. Contact a doctor if:  You have a fever.  Your pain is worse.  You have trouble swallowing. Get help right away if:  You cough up blood.  You have trouble breathing. This information is not intended to replace advice given to you by your health care provider. Make sure you discuss any questions you have with your health care provider. Document Released: 08/16/2011 Document Revised: 02/02/2016 Document Reviewed: 02/09/2014 Elsevier Interactive Patient Education  2018 [employer]  

## 2018-07-23 ENCOUNTER — Encounter: Payer: Self-pay | Admitting: Family Medicine

## 2018-07-23 NOTE — Progress Notes (Signed)
[facility]/thx dmf 

## 2018-07-23 NOTE — Progress Notes (Signed)
[facility]/thx dmf

## 2018-07-31 ENCOUNTER — Encounter: Payer: Self-pay | Admitting: Nurse Practitioner

## 2018-07-31 ENCOUNTER — Ambulatory Visit: Payer: Self-pay | Admitting: *Deleted

## 2018-07-31 ENCOUNTER — Ambulatory Visit: Payer: BLUE CROSS/BLUE SHIELD | Admitting: Nurse Practitioner

## 2018-07-31 VITALS — BP 118/80 | HR 84 | Temp 97.4°F | Ht 65.0 in | Wt 191.0 lb

## 2018-07-31 DIAGNOSIS — F419 Anxiety disorder, unspecified: Secondary | ICD-10-CM

## 2018-07-31 DIAGNOSIS — F32A Depression, unspecified: Secondary | ICD-10-CM

## 2018-07-31 DIAGNOSIS — F329 Major depressive disorder, single episode, unspecified: Secondary | ICD-10-CM | POA: Diagnosis not present

## 2018-07-31 MED ORDER — SERTRALINE HCL 100 MG PO TABS: 100.0000 mg | ORAL_TABLET | Freq: Every day | ORAL | 1 refills | Status: DC

## 2018-07-31 NOTE — Telephone Encounter (Signed)
Pt calling tearful and upset and requesting to come in for appt to talk with Dr. Dayton MartesAron. 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 Pt states that over the last couple of weeks her stress level at work has worsened and she does not know what to do. Pt states she has been taking Zoloft for a while and does have Xanax if needed but she has not taken it in a while. Pt states she was also prescribed Trazodone but doesn't want to take it know because it makes her kind of sleepy. Pt states that her home life is ok her and her husband is fine and her daughter just got engaged.Notified pt that Dr. Dayton MartesAron was not in the office today but would contact FC, Ronnie to see if pt was able to be seen by Claris Gowerharlotte, NP. Per Christen Bameonnie ok for pt to be seen today with Charlotte,NP. Pt scheduled for appt at 2:30 pm and advised that if symptoms became worse before appt to return call to the office. Pt verbalized understanding.  Reason for Disposition . Patient sounds very upset or troubled to the triager  Answer Assessment - Initial Assessment Questions 1. CONCERN: "What happened that made you call today?"     Pt states that all the emotions hit her today due to stress at work 2. ANXIETY SYMPTOM SCREENING: "Can you describe how you have been feeling?"  (e.g., tense, restless, panicky, anxious, keyed up, trouble sleeping, trouble concentrating)     Stressed and overwhelmed 3. ONSET: "How long have you been feeling this way?"     For the few days but stress has been building up for the past couple of weeks 4. RECURRENT: "Have you felt this way before?"  If yes: "What happened that time?" "What helped these feelings go away in the past?"      yes 5. RISK OF HARM - SUICIDAL IDEATION:  "Do you ever have thoughts of hurting or killing yourself?"  (e.g., yes, no, no but preoccupation with thoughts about death)   - INTENT:  "Do you  have thoughts of hurting or killing yourself right NOW?" (e.g., yes, no, N/A)   - PLAN: "Do you have a specific plan for how you would do this?" (e.g., gun, knife, overdose, no plan, N/A)     No 6. RISK OF HARM - HOMICIDAL IDEATION:  "Do you ever have thoughts of hurting or killing someone else?"  (e.g., yes, no, no but preoccupation with thoughts about death)   - INTENT:  "Do you have thoughts of hurting or killing someone right NOW?" (e.g., yes, no, N/A)   - PLAN: "Do you have a specific plan for how you would do this?" (e.g., gun, knife, no plan, N/A)      No 7. FUNCTIONAL IMPAIRMENT: "How have things been going for you overall in your life? Have you had any more difficulties than usual doing your normal daily activities?"  (e.g., better, same, worse; self-care, school, work, interactions)     Pt states life has been good and states that her husband is good and her daughter just got engaged and she has been helping her plan 8. SUPPORT: "Who is with you now?" "Who do you live with?" "Do you have family or friends nearby who you can talk to?"      Pt is at work  9. THERAPIST: "Do you have a counselor  or therapist? Name?"     No 10. 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 CAFFEINE ABUSE: "Do you drink caffeinated beverages, and how much each day?" (e.g., coffee, tea, colas)       Not assessed 12. SUBSTANCE ABUSE: "Do you use any illegal drugs or alcohol?"       Not assessed 13. OTHER SYMPTOMS: "Do you have any other physical symptoms right now?" (e.g., chest pain, palpitations, difficulty breathing, fever)       No 14. PREGNANCY: "Is there any chance you are pregnant?" "When was your last menstrual period?"       Not assessed  Protocols used: ANXIETY AND PANIC ATTACK-A-AH

## 2018-07-31 NOTE — Patient Instructions (Signed)
You will be contacted to schedule appt with psychology.  You may also call and schedule appt with any of the following: [phone] ext. [phone] [facility]: [phone]. Jolayne [phone]  Increase Zoloft to 100mg  daily.  F/up with Dr. Dayton Martes in 2weeks.   Living With Depression Everyone experiences occasional disappointment, sadness, and loss in their lives. When you are feeling down, blue, or sad for at least 2 weeks in a row, it may mean that you have depression. Depression can affect your thoughts and feelings, relationships, daily activities, and physical health. It is caused by changes in the way your brain functions. If you receive a diagnosis of depression, your health care provider will tell you which type of depression you have and what treatment options are available to you. If you are living with depression, there are ways to help you recover from it and also ways to prevent it from coming back. How to cope with lifestyle changes Coping with stress Stress is your body's reaction to life changes and events, both good and bad. Stressful situations may include:  Getting married.  The death of a spouse.  Losing a job.  Retiring.  Having a baby.  Stress can last just a few hours or it can be ongoing. Stress can play a major role in depression, so it is important to learn both how to cope with stress and how to think about it differently. Talk with your health care provider or a counselor if you would like to learn more about stress reduction. He or she may suggest some stress reduction techniques, such as:  Music therapy. This can include creating music or listening to music. Choose music that you enjoy and that inspires you.  Mindfulness-based meditation. This kind of meditation can be done while sitting or walking. It involves being aware of your normal breaths, rather than trying to control your  breathing.  Centering prayer. This is a kind of meditation that involves focusing on a spiritual word or phrase. Choose a word, phrase, or sacred image that is meaningful to you and that brings you peace.  Deep breathing. To do this, expand your stomach and inhale slowly through your nose. Hold your breath for 3-5 seconds, then exhale slowly, allowing your stomach muscles to relax.  Muscle relaxation. This involves intentionally tensing muscles then relaxing them.  Choose a stress reduction technique that fits your lifestyle and personality. Stress reduction techniques take time and practice to develop. Set aside 5-15 minutes a day to do them. Therapists can offer training in these techniques. The training may be covered by some insurance plans. Other things you can do to manage stress include:  Keeping a stress diary. This can help you learn what triggers your stress and ways to control your response.  Understanding what your limits are and saying no to requests or events that lead to a schedule that is too full.  Thinking about how you respond to certain situations. You may not be able to control everything, but you can control how you react.  Adding humor to your life by watching funny films or TV shows.  Making time for activities that help you relax and not feeling guilty about spending your time this way.  Medicines Your health care provider may suggest certain medicines if he or she feels that they will help improve your condition. Avoid using alcohol and other substances that may prevent your medicines from working properly (may interact).  It is also important to:  Talk with your pharmacist or health care provider about all the medicines that you take, their possible side effects, and what medicines are safe to take together.  Make it your goal to take part in all treatment decisions (shared decision-making). This includes giving input on the side effects of medicines. It is best if  shared decision-making with your health care provider is part of your total treatment plan.  If your health care provider prescribes a medicine, you may not notice the full benefits of it for 4-8 weeks. Most people who are treated for depression need to be on medicine for at least 6-12 months after they feel better. If you are taking medicines as part of your treatment, do not stop taking medicines without first talking to your health care provider. You may need to have the medicine slowly decreased (tapered) over time to decrease the risk of harmful side effects. Relationships Your health care provider may suggest family therapy along with individual therapy and drug therapy. While there may not be family problems that are causing you to feel depressed, it is still important to make sure your family learns as much as they can about your mental health. Having your family's support can help make your treatment successful. How to recognize changes in your condition Everyone has a different response to treatment for depression. Recovery from major depression happens when you have not had signs of major depression for two months. This may mean that you will start to:  Have more interest in doing activities.  Feel less hopeless than you did 2 months ago.  Have more energy.  Overeat less often, or have better or improving appetite.  Have better concentration.  Your health care provider will work with you to decide the next steps in your recovery. It is also important to recognize when your condition is getting worse. Watch for these signs:  Having fatigue or low energy.  Eating too much or too little.  Sleeping too much or too little.  Feeling restless, agitated, or hopeless.  Having trouble concentrating or making decisions.  Having unexplained physical complaints.  Feeling irritable, angry, or aggressive.  Get help as soon as you or your family members notice these symptoms coming  back. How to get support and help from others How to talk with friends and family members about your condition Talking to friends and family members about your condition can provide you with one way to get support and guidance. Reach out to trusted friends or family members, explain your symptoms to them, and let them know that you are working with a health care provider to treat your depression. Financial resources Not all insurance plans cover mental health care, so it is important to check with your insurance carrier. If paying for co-pays or counseling services is a problem, search for a local or county mental health care center. They may be able to offer public mental health care services at low or no cost when you are not able to see a private health care provider. If you are taking medicine for depression, you may be able to get the generic form, which may be less expensive. Some makers of prescription medicines also offer help to patients who cannot afford the medicines they need. Follow these instructions at home:  Get the right amount and quality of sleep.  Cut down on using caffeine, tobacco, alcohol, and other potentially harmful substances.  Try to exercise, such as walking or  lifting small weights.  Take over-the-counter and prescription medicines only as told by your health care provider.  Eat a healthy diet that includes plenty of vegetables, fruits, whole grains, low-fat dairy products, and lean protein. Do not eat a lot of foods that are high in solid fats, added sugars, or salt.  Keep all follow-up visits as told by your health care provider. This is important. Contact a health care provider if:  You stop taking your antidepressant medicines, and you have any of these symptoms: ? Nausea. ? Headache. ? Feeling lightheaded. ? Chills and body aches. ? Not being able to sleep (insomnia).  You or your friends and family think your depression is getting worse. Get help  right away if:  You have thoughts of hurting yourself or others. If you ever feel like you may hurt yourself or others, or have thoughts about taking your own life, get help right away. You can go to your nearest emergency department or call:  Your local emergency services (911 in the [place]  A suicide crisis helpline, such as the National Suicide Prevention Lifeline at (431)398-0821. This is open 24-hours a day.  Summary  If you are living with depression, there are ways to help you recover from it and also ways to prevent it from coming back.  Work with your health care team to create a management plan that includes counseling, stress management techniques, and healthy lifestyle habits. This information is not intended to replace advice given to you by your health care provider. Make sure you discuss any questions you have with your health care provider. Document Released: 07/30/2016 Document Revised: 07/30/2016 Document Reviewed: 07/30/2016 Elsevier Interactive Patient Education  [employer].

## 2018-07-31 NOTE — Progress Notes (Signed)
Subjective:  Patient ID: Audrey Rogers, female    DOB: 1965-12-11  Age: 52 y.o. MRN: [id number]  CC: Anxiety (pt is complaining of anxiety,feeling emotional around this time of the year due to family pass away, stress out at work, alot going on "not feel like herself" :. going on 2-3 wks. zoloft is not helping much. )  Depression         This is a chronic problem.  The current episode started more than 1 year ago (onset 41yrs ago).   The onset quality is gradual.   The problem occurs constantly.  The problem has been gradually worsening since onset.  Associated symptoms include fatigue, insomnia, decreased interest, body aches, myalgias and sad.  Associated symptoms include no decreased concentration and no suicidal ideas.     The symptoms are aggravated by family issues and work stress.  Past treatments include SSRIs - Selective serotonin reuptake inhibitors and other medications.  Compliance with treatment is good.  Previous treatment provided mild relief.  Risk factors include stress and marital problems.  mood seems to be worse during holiday period since death of parents 3yrs ago. Started use of zoloft 63yrs ago after daughter left for college. Use of trazodone and xanax as needed. stopped counseling in past because husband will not participate. Reports she feels unappreciated at home. She tried talking to her husband about her feelings but does not think he can relate. Has conflict with a co worker which led to today's emotional breakdown.  Her sister provides emotional support, so she plans to spend the weekend with her. Sister lives close to Audrey Rogers.  Reviewed past Medical, Social and Family history today.  Outpatient Medications Prior to Visit  Medication Sig Dispense Refill  . ALPRAZolam (XANAX) 0.5 MG tablet   0  . Probiotic Product (PROBIOTIC DAILY PO) Take by mouth.    . SYNTHROID 88 MCG tablet Take 1 tablet by mouth daily.  1  . traZODone (DESYREL) 50 MG tablet Take 0.5-1  tablets (25-50 mg total) by mouth at bedtime as needed for sleep. 30 tablet 3  . sertraline (ZOLOFT) 50 MG tablet   0  . albuterol (PROVENTIL HFA;VENTOLIN HFA) 108 (90 Base) MCG/ACT inhaler Inhale 1-2 puffs into the lungs every 6 (six) hours as needed. (Patient not taking: Reported on 07/31/2018) 1 Inhaler 0  . azithromycin (ZITHROMAX Z-PAK) 250 MG tablet Take 1 tablet (250 mg total) by mouth daily. Take 2tabs on first day, then 1tab once a day till complete (Patient not taking: Reported on 07/31/2018) 6 tablet 0  . guaiFENesin-dextromethorphan (ROBITUSSIN DM) 100-10 MG/5ML syrup Take 5 mLs by mouth every 4 (four) hours as needed for cough. (Patient not taking: Reported on 07/31/2018) 118 mL 0  . HYDROcodone-homatropine (HYCODAN) 5-1.5 MG/5ML syrup Take 5 mLs by mouth every 8 (eight) hours as needed for cough. (Patient not taking: Reported on 07/31/2018) 100 mL 0   No facility-administered medications prior to visit.     ROS See HPI  Objective:  BP 118/80   Pulse 84   Temp (!) 97.4 F (36.3 C) (Oral)   Ht 5\' 5"  (1.651 m)   Wt 191 lb (86.6 kg)   SpO2 97%   BMI 31.78 kg/m   BP Readings from Last 3 Encounters:  07/31/18 118/80  07/10/18 132/84  06/18/18 116/78    Wt Readings from Last 3 Encounters:  07/31/18 191 lb (86.6 kg)  07/10/18 192 lb (87.1 kg)  06/18/18 190 lb 12.8 oz (86.5  kg)    Physical Exam  Constitutional: She is oriented to person, place, and time.  Neurological: She is alert and oriented to person, place, and time.  Psychiatric: She is slowed. Cognition and memory are normal. She exhibits a depressed mood. She expresses no homicidal and no suicidal ideation. She expresses no suicidal plans and no homicidal plans.  Crying during visit.  Vitals reviewed.   Lab Results  Component Value Date   WBC 8.8 01/09/2018   HGB 12.7 01/09/2018   HCT 38 01/09/2018   PLT 287 01/09/2018   GLUCOSE 79 06/18/2018   CHOL 210 (H) 06/18/2018   TRIG 170.0 (H) 06/18/2018   HDL  53.20 06/18/2018   LDLCALC 123 (H) 06/18/2018   ALT 16 06/18/2018   AST 15 06/18/2018   NA 138 06/18/2018   K 4.3 06/18/2018   CL 103 06/18/2018   CREATININE 0.72 06/18/2018   BUN 16 06/18/2018   CO2 30 06/18/2018   TSH 1.53 06/18/2018  .  Assessment & Plan:   Audrey Rogers was seen today for anxiety.  Diagnoses and all orders for this visit:  Anxiety and depression -     sertraline (ZOLOFT) 100 MG tablet; Take 1 tablet (100 mg total) by mouth at bedtime. -     Ambulatory referral to Psychology   I have discontinued Audrey Rogers guaiFENesin-dextromethorphan, azithromycin, HYDROcodone-homatropine, and albuterol. I have also changed her sertraline. Additionally, I am having her maintain her ALPRAZolam, SYNTHROID, traZODone, and Probiotic Product (PROBIOTIC DAILY PO).  Meds ordered this encounter  Medications  . sertraline (ZOLOFT) 100 MG tablet    Sig: Take 1 tablet (100 mg total) by mouth at bedtime.    Dispense:  30 tablet    Refill:  1    Order Specific Question:   Supervising Provider    Answer:   Dianne DunARON, TALIA M [[id number]]    Problem List Items Addressed This Visit      Other   Anxiety and depression - Primary   Relevant Medications   sertraline (ZOLOFT) 100 MG tablet   Other Relevant Orders   Ambulatory referral to Psychology       Follow-up: Return in about 2 weeks (around 08/14/2018) for Depression with Dr. Dayton MartesAron.  Alysia Pennaharlotte Bekki Tavenner, NP

## 2018-08-01 ENCOUNTER — Other Ambulatory Visit: Payer: Self-pay | Admitting: Nurse Practitioner

## 2018-08-01 DIAGNOSIS — J209 Acute bronchitis, unspecified: Secondary | ICD-10-CM

## 2018-08-01 DIAGNOSIS — J040 Acute laryngitis: Secondary | ICD-10-CM

## 2018-08-01 DIAGNOSIS — J0100 Acute maxillary sinusitis, unspecified: Secondary | ICD-10-CM

## 2018-08-11 ENCOUNTER — Ambulatory Visit
Admission: RE | Admit: 2018-08-11 | Discharge: 2018-08-11 | Disposition: A | Discharge status: Home / Self Care | Payer: BLUE CROSS/BLUE SHIELD | Source: Ambulatory Visit | Attending: Family Medicine | Admitting: Family Medicine

## 2018-08-11 DIAGNOSIS — Z1231 Encounter for screening mammogram for malignant neoplasm of breast: Secondary | ICD-10-CM | POA: Diagnosis not present

## 2018-08-12 ENCOUNTER — Ambulatory Visit: Payer: BLUE CROSS/BLUE SHIELD | Admitting: Family Medicine

## 2018-08-12 ENCOUNTER — Encounter: Payer: Self-pay | Admitting: Family Medicine

## 2018-08-12 VITALS — BP 128/82 | HR 94 | Temp 97.9°F | Ht 65.0 in | Wt 195.0 lb

## 2018-08-12 DIAGNOSIS — F419 Anxiety disorder, unspecified: Secondary | ICD-10-CM | POA: Diagnosis not present

## 2018-08-12 DIAGNOSIS — F32A Depression, unspecified: Secondary | ICD-10-CM

## 2018-08-12 DIAGNOSIS — F329 Major depressive disorder, single episode, unspecified: Secondary | ICD-10-CM

## 2018-08-12 NOTE — Patient Instructions (Addendum)
Great to see you. Happy Holidays. Let's continue your current dose of zoloft.  Stop by on your way out to discuss an appointment with our therapist.  Please update me in a couple of weeks.

## 2018-08-12 NOTE — Assessment & Plan Note (Signed)
>  25 minutes spent in face to face time with patient, >50% spent in counselling or coordination of care She does feel symptoms are improving slightly.  She will continue increased dose of zoloft - 100 mg daily and make an appointment with Bambi. She will update me in 2-3 weeks via mychart or phone, sooner if she has any concerns. The patient indicates understanding of these issues and agrees with the plan.

## 2018-08-12 NOTE — Progress Notes (Signed)
Subjective:   Patient ID: Audrey Rogers, female    DOB: 1965-12-20, 52 y.o.   MRN: [id number]  Audrey Rogers is a pleasant 52 y.o. year old female who presents to clinic today with Follow-up (She has had a lot going on at work. Things have improved some at work. 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 Zoloft was increased to 100mg  has not noticed any difference. Received a call to make an appointment for counseling-has not had a chance to return call to make an appointment. She has been talking to her family that has helped.   )  on 08/12/2018  HPI:  Depression and anxiety- Saw Alysia Penna for anxiety and depression on 07/31/18-note reviewed.  She has struggled with chronic depression and anxiety for years but 2-3 weeks prior, acutely worse- death in the family, work stress.  Zoloft at previous dose was not working well.  Charlotte increased her zoloft to 100 mg daily, referred to psychotherapy and advised to follow up with me here today.  She already does feel somewhat better.  Family is helping.  Has not yet scheduled psychotherapy appointment.  Lab Results  Component Value Date   TSH 1.53 06/18/2018     GAD 7 : Generalized Anxiety Score 06/18/2018  Nervous, Anxious, on Edge 1  Control/stop worrying 1  Worry too much - different things 1  Trouble relaxing 1  Restless 0  Easily annoyed or irritable 1  Afraid - awful might happen 1  Total GAD 7 Score 6  Anxiety Difficulty Somewhat difficult    Depression screen [facility] 2/9 08/12/2018 06/18/2018  Decreased Interest 2 0  Down, Depressed, Hopeless 1 1  PHQ - 2 Score 3 1  Altered sleeping 1 3  Tired, decreased energy 2 3  Change in appetite 2 2  Feeling bad or failure about yourself  1 0  Trouble concentrating 1 0  Moving slowly or fidgety/restless 0 0  Suicidal thoughts 0 0  PHQ-9 Score 10 9  Difficult doing work/chores - Somewhat difficult    Current Outpatient Medications on File Prior to Visit    Medication Sig Dispense Refill  . Probiotic Product (PROBIOTIC DAILY PO) Take by mouth.    . sertraline (ZOLOFT) 100 MG tablet Take 1 tablet (100 mg total) by mouth at bedtime. 30 tablet 1  . SYNTHROID 88 MCG tablet Take 1 tablet by mouth daily.  1  . traZODone (DESYREL) 50 MG tablet Take 0.5-1 tablets (25-50 mg total) by mouth at bedtime as needed for sleep. 30 tablet 3  . ALPRAZolam (XANAX) 0.5 MG tablet   0   No current facility-administered medications on file prior to visit.     Allergies  Allergen Reactions  . Sulfa Antibiotics     Past Medical History:  Diagnosis Date  . Anxiety   . Depression   . History of chicken pox   . History of diverticulosis 10/15/2014   Per Colonoscopy  . History of fibrocystic disease of breast   . History of hemorrhoids   . Hyperlipidemia   . Hypertension   . Hypothyroidism     Past Surgical History:  Procedure Laterality Date  . ANAL FISSURE REPAIR  01/2010   Dr. Zachery Dakins  . COLONOSCOPY  10/15/2014   Diverticulosis/repeat 10 years/[facility] Dr. Caryl Never.    Family History  Problem Relation Age of Onset  . Diabetes Mother   . Hypertension Mother   . Stroke Mother   . Hypercholesterolemia Father   .  Hypertension Father   . Heart disease Father   . Hypertension Brother   . Hypercholesterolemia Brother   . Heart disease Brother   . Stroke Maternal Grandmother   . Hypertension Maternal Grandmother   . Diabetes Maternal Grandmother   . Stroke Maternal Grandfather   . Hypertension Maternal Grandfather   . Diabetes Maternal Grandfather     Social History   Socioeconomic History  . Marital status: Married    Spouse name: Not on file  . Number of children: Not on file  . Years of education: Not on file  . Highest education level: Not on file  Occupational History  . Not on file  Social Needs  . Financial resource strain: Not on file  . Food insecurity:    Worry: Not on file    Inability: Not on file  .  Transportation needs:    Medical: Not on file    Non-medical: Not on file  Tobacco Use  . Smoking status: Never Smoker  . Smokeless tobacco: Never Used  Substance and Sexual Activity  . Alcohol use: Not on file  . Drug use: Not on file  . Sexual activity: Not on file  Lifestyle  . Physical activity:    Days per week: Not on file    Minutes per session: Not on file  . Stress: Not on file  Relationships  . Social connections:    Talks on phone: Not on file    Gets together: Not on file    Attends religious service: Not on file    Active member of club or organization: Not on file    Attends meetings of clubs or organizations: Not on file    Relationship status: Not on file  . Intimate partner violence:    Fear of current or ex partner: Not on file    Emotionally abused: Not on file    Physically abused: Not on file    Forced sexual activity: Not on file  Other Topics Concern  . Not on file  Social History Narrative  . Not on file   The PMH, PSH, Social History, Family History, Medications, and allergies [facility] and have been updated if relevant.     Review of Systems  Psychiatric/Behavioral: Positive for dysphoric mood and sleep disturbance. Negative for agitation, behavioral problems, confusion, decreased concentration, hallucinations, self-injury and suicidal ideas. The patient is nervous/anxious. The patient is not hyperactive.   All other systems reviewed and are negative.      Objective:    BP 128/82   Pulse 94   Temp 97.9 F (36.6 C) (Oral)   Ht 5\' 5"  (1.651 m)   Wt 195 lb (88.5 kg)   SpO2 97%   BMI 32.45 kg/m   Wt Readings from Last 3 Encounters:  08/12/18 195 lb (88.5 kg)  07/31/18 191 lb (86.6 kg)  07/10/18 192 lb (87.1 kg)    Physical Exam  Constitutional: She is oriented to person, place, and time. She appears well-developed and well-nourished. No distress.  HENT:  Head: Normocephalic and atraumatic.  Eyes: EOM are normal.   Neck: Normal range of motion.  Cardiovascular: Normal rate.  Pulmonary/Chest: Effort normal.  Neurological: She is alert and oriented to person, place, and time. No cranial nerve deficit.  Skin: Skin is warm and dry. She is not diaphoretic.  Psychiatric: She has a normal mood and affect. Her behavior is normal. Judgment and thought content normal.  Nursing note and vitals reviewed.  Assessment & Plan:   Anxiety and depression No follow-ups on file.

## 2018-08-13 ENCOUNTER — Ambulatory Visit (INDEPENDENT_AMBULATORY_CARE_PROVIDER_SITE_OTHER): Payer: BLUE CROSS/BLUE SHIELD | Admitting: Pulmonary Disease

## 2018-08-13 ENCOUNTER — Encounter: Payer: Self-pay | Admitting: Pulmonary Disease

## 2018-08-13 VITALS — BP 126/72 | HR 80 | Ht 65.0 in | Wt 195.0 lb

## 2018-08-13 DIAGNOSIS — F5104 Psychophysiologic insomnia: Secondary | ICD-10-CM

## 2018-08-13 DIAGNOSIS — R0683 Snoring: Secondary | ICD-10-CM | POA: Diagnosis not present

## 2018-08-13 DIAGNOSIS — G4733 Obstructive sleep apnea (adult) (pediatric): Secondary | ICD-10-CM

## 2018-08-13 DIAGNOSIS — G4719 Other hypersomnia: Secondary | ICD-10-CM

## 2018-08-13 NOTE — Patient Instructions (Signed)
Moderate probability of significant sleep disordered breathing  Insomnia  We will set you up with a home sleep study  Treatment options will include CPAP therapy  I will see you back in the office in about 3 months Call with any significant concerns   Sleep Apnea Sleep apnea is a condition in which breathing pauses or becomes shallow during sleep. Episodes of sleep apnea usually last 10 seconds or longer, and they may occur as many as 20 times an hour. Sleep apnea disrupts your sleep and keeps your body from getting the rest that it needs. This condition can increase your risk of certain health problems, including:  Heart attack.  Stroke.  Obesity.  Diabetes.  Heart failure.  Irregular heartbeat.  There are three kinds of sleep apnea:  Obstructive sleep apnea. This kind is caused by a blocked or collapsed airway.  Central sleep apnea. This kind happens when the part of the brain that controls breathing does not send the correct signals to the muscles that control breathing.  Mixed sleep apnea. This is a combination of obstructive and central sleep apnea.  What are the causes? The most common cause of this condition is a collapsed or blocked airway. An airway can collapse or become blocked if:  Your throat muscles are abnormally relaxed.  Your tongue and tonsils are larger than normal.  You are overweight.  Your airway is smaller than normal.  What increases the risk? This condition is more likely to develop in people who:  Are overweight.  Smoke.  Have a smaller than normal airway.  Are elderly.  Are female.  Drink alcohol.  Take sedatives or tranquilizers.  Have a family history of sleep apnea.  What are the signs or symptoms? Symptoms of this condition include:  Trouble staying asleep.  Daytime sleepiness and tiredness.  Irritability.  Loud snoring.  Morning headaches.  Trouble concentrating.  Forgetfulness.  Decreased interest in  sex.  Unexplained sleepiness.  Mood swings.  Personality changes.  Feelings of depression.  Waking up often during the night to urinate.  Dry mouth.  Sore throat.  How is this diagnosed? This condition may be diagnosed with:  A medical history.  A physical exam.  A series of tests that are done while you are sleeping (sleep study). These tests are usually done in a sleep lab, but they may also be done at home.  How is this treated? Treatment for this condition aims to restore normal breathing and to ease symptoms during sleep. It may involve managing health issues that can affect breathing, such as high blood pressure or obesity. Treatment may include:  Sleeping on your side.  Using a decongestant if you have nasal congestion.  Avoiding the use of depressants, including alcohol, sedatives, and narcotics.  Losing weight if you are overweight.  Making changes to your diet.  Quitting smoking.  Using a device to open your airway while you sleep, such as: ? An oral appliance. This is a custom-made mouthpiece that shifts your lower jaw forward. ? A continuous positive airway pressure (CPAP) device. This device delivers oxygen to your airway through a mask. ? A nasal expiratory positive airway pressure (EPAP) device. This device has valves that you put into each nostril. ? A bi-level positive airway pressure (BPAP) device. This device delivers oxygen to your airway through a mask.  Surgery if other treatments do not work. During surgery, excess tissue is removed to create a wider airway.  It is important to get treatment  for sleep apnea. Without treatment, this condition can lead to:  High blood pressure.  Coronary artery disease.  (Men) An inability to achieve or maintain an erection (impotence).  Reduced thinking abilities.  Follow these instructions at home:  Make any lifestyle changes that your health care provider recommends.  Eat a healthy, well-balanced  diet.  Take over-the-counter and prescription medicines only as told by your health care provider.  Avoid using depressants, including alcohol, sedatives, and narcotics.  Take steps to lose weight if you are overweight.  If you were given a device to open your airway while you sleep, use it only as told by your health care provider.  Do not use any tobacco products, such as cigarettes, chewing tobacco, and e-cigarettes. If you need help quitting, ask your health care provider.  Keep all follow-up visits as told by your health care provider. This is important. Contact a health care provider if:  The device that you received to open your airway during sleep is uncomfortable or does not seem to be working.  Your symptoms do not improve.  Your symptoms get worse. Get help right away if:  You develop chest pain.  You develop shortness of breath.  You develop discomfort in your back, arms, or stomach.  You have trouble speaking.  You have weakness on one side of your body.  You have drooping in your face. These symptoms may represent a serious problem that is an emergency. Do not wait to see if the symptoms will go away. Get medical help right away. Call your local emergency services (911 in the [place] Do not drive yourself to the hospital. This information is not intended to replace advice given to you by your health care provider. Make sure you discuss any questions you have with your health care provider. Document Released: 08/17/2002 Document Revised: 04/22/2016 Document Reviewed: 06/06/2015 Elsevier Interactive Patient Education  [employer].

## 2018-08-13 NOTE — Progress Notes (Signed)
Audrey Rogers    [id number]    1966/09/07  Primary Care Physician:Aron, Bryn Gullingalia M, MD  Referring Physician: Dianne DunAron, Talia M, MD [address] [place], [place] [id number]  Chief complaint:   Patient with a history of snoring, witnessed apneas  HPI:  History of snoring and witnessed apneas of many years History of insomnia for which she was using Xanax previously, recently switched to trazodone Wakes up multiple times during the night Usually finds it difficult to fall asleep without medications She does have a history of anxiety and depression managed with Zoloft   Patient usually tries to go to bed by 10:00, falls asleep quickly Wakes up between 4-5 times during the night Final wake up time about 6 AM  Denies any dryness of her mouth in the morning No morning headaches No memory issues  Outpatient Encounter Medications as of 08/13/2018  Medication Sig  . ALPRAZolam (XANAX) 0.5 MG tablet   . Probiotic Product (PROBIOTIC DAILY PO) Take by mouth.  . sertraline (ZOLOFT) 100 MG tablet Take 1 tablet (100 mg total) by mouth at bedtime.  Marland Kitchen. SYNTHROID 88 MCG tablet Take 1 tablet by mouth daily.  . traZODone (DESYREL) 50 MG tablet Take 0.5-1 tablets (25-50 mg total) by mouth at bedtime as needed for sleep.   No facility-administered encounter medications on file as of 08/13/2018.     Allergies as of 08/13/2018 - Review Complete 08/13/2018  Allergen Reaction Noted  . Sulfa antibiotics  10/05/2014    Past Medical History:  Diagnosis Date  . Anxiety   . Depression   . History of chicken pox   . History of diverticulosis 10/15/2014   Per Colonoscopy  . History of fibrocystic disease of breast   . History of hemorrhoids   . Hyperlipidemia   . Hypertension   . Hypothyroidism     Past Surgical History:  Procedure Laterality Date  . ANAL FISSURE REPAIR  01/2010   Dr. Zachery DakinsWeatherly  . COLONOSCOPY  10/15/2014   Diverticulosis/repeat 10 years/[facility] Dr.  Caryl NeverJue.    Family History  Problem Relation Age of Onset  . Diabetes Mother   . Hypertension Mother   . Stroke Mother   . Hypercholesterolemia Father   . Hypertension Father   . Heart disease Father   . Hypertension Brother   . Hypercholesterolemia Brother   . Heart disease Brother   . Stroke Maternal Grandmother   . Hypertension Maternal Grandmother   . Diabetes Maternal Grandmother   . Stroke Maternal Grandfather   . Hypertension Maternal Grandfather   . Diabetes Maternal Grandfather     Social History   Socioeconomic History  . Marital status: Married    Spouse name: Not on file  . Number of children: Not on file  . Years of education: Not on file  . Highest education level: Not on file  Occupational History  . Not on file  Social Needs  . Financial resource strain: Not on file  . Food insecurity:    Worry: Not on file    Inability: Not on file  . Transportation needs:    Medical: Not on file    Non-medical: Not on file  Tobacco Use  . Smoking status: Never Smoker  . Smokeless tobacco: Never Used  Substance and Sexual Activity  . Alcohol use: Not on file  . Drug use: Not on file  . Sexual activity: Not on file  Lifestyle  . Physical activity:  Days per week: Not on file    Minutes per session: Not on file  . Stress: Not on file  Relationships  . Social connections:    Talks on phone: Not on file    Gets together: Not on file    Attends religious service: Not on file    Active member of club or organization: Not on file    Attends meetings of clubs or organizations: Not on file    Relationship status: Not on file  . Intimate partner violence:    Fear of current or ex partner: Not on file    Emotionally abused: Not on file    Physically abused: Not on file    Forced sexual activity: Not on file  Other Topics Concern  . Not on file  Social History Narrative  . Not on file    Review of Systems  Constitutional: Negative.   HENT: Negative.   Eyes:  Negative.   Respiratory: Positive for apnea.   Psychiatric/Behavioral: Positive for sleep disturbance.  All other systems reviewed and are negative.   Vitals:   08/13/18 1613  BP: 126/72  Pulse: 80  SpO2: 96%     Physical Exam  Constitutional: She appears well-developed and well-nourished.  HENT:  Head: Normocephalic and atraumatic.  Mallampati 3  Eyes: Conjunctivae and EOM are normal. Right eye exhibits no discharge. Left eye exhibits no discharge.  Neck: Normal range of motion. Neck supple. No tracheal deviation present. No thyromegaly present.  Cardiovascular: Normal rate and regular rhythm.  Pulmonary/Chest: Effort normal and breath sounds normal. No respiratory distress. She has no wheezes.  Abdominal: Soft. Bowel sounds are normal. She exhibits no distension. There is no tenderness.   Epworth Sleepiness Scale of 11   Assessment:  High probability of significant sleep disordered breathing  Insomnia related to sleep disordered breathing  Daytime sleepiness  Plan/Recommendations: The physiology of sleep disordered breathing discussed with the patient's  Main options for sleep disordered breathing discussed with the patient's  We will order a home sleep study with possibility of treatment with an auto titrating CPAP  For the management of a insomnia after adequately treating her sleep disordered breathing  I will see her back in the office in about 3 months Encouraged to call with any significant concerns  Virl Diamond MD [facility] 08/13/2018, 4:41 PM  CC: Dianne Dun, MD

## 2018-08-26 ENCOUNTER — Other Ambulatory Visit: Payer: Self-pay | Admitting: Family Medicine

## 2018-08-27 ENCOUNTER — Encounter: Payer: Self-pay | Admitting: Family Medicine

## 2018-09-16 NOTE — Progress Notes (Signed)
Subjective:   Patient ID: Audrey Rogers, female    DOB: 19-Mar-1966, 53 y.o.   MRN: [id number]  Audrey ColderDonna [place] is a pleasant 53 y.o. year old female who presents to clinic today with Follow-up (Patient was last seen on 12.3.19 for anxiety with depression after her Zoloft was increased from 50mg  to 100mg  by Claris Gowerharlotte on 11.21.19.  She was also referred to psychotherapy at that time.  She stated that she rec a call to schedule appt but did not have the chance to make the appt.  She did find some relief by talking with family and her GAD was 6 & PHQ was 10.  She had decided to decrease the Zoloft back to 50mg  as she did not see any difference.)  on 09/17/2018  HPI:  Here for follow up.  Last saw pt on 08/12/2018.  Note reviewed.  Depression and anxiety-  Saw Alysia PennaCharlotte Nche for anxiety and depression on 07/31/18-note reviewed.  She has struggled with chronic depression and anxiety for years but 2-3 weeks prior, acutely worse- death in the family, work stress.  Zoloft at previous dose was not working well.  Charlotte increased her zoloft to 100 mg daily  When she saw me on 08/12/18, she already was feeling much better.  She has not yet made an appointment to see Bambi.  On 08/27/18- she sent me the following mychart message-  Good evening Dr. Dayton MartesAron,   I just wanted to let you know that I am doing good with my anxiety etc. Also, I decided not to take the 100 mg of Sertraline I went back to 50. I really didn't feel any different with higher dosage.   I hope you have a Merry Christmas and Happy New Year!   Thank you!   She is walking more which helped with her anxiety. Learning how to cope better with work stressors.  Dep[facility] 2/9 09/17/2018 08/12/2018 06/18/2018  Decreased Interest 1 2 0  Down, Depressed, Hopeless 1 1 1   PHQ - 2 Score 2 3 1   Altered sleeping 2 1 3   Tired, decreased energy 2 2 3   Change in appetite 0 2 2  Feeling bad or failure about yourself  1 1 0   Trouble concentrating 0 1 0  Moving slowly or fidgety/restless 0 0 0  Suicidal thoughts 0 0 0  PHQ-9 Score 7 10 9   Difficult doing work/chores Somewhat difficult - Somewhat difficult   GAD 7 : Generalized Anxiety Score 09/17/2018 08/12/2018 06/18/2018  Nervous, Anxious, on Edge 0 1 1  Control/stop worrying 1 1 1   Worry too much - different things 1 1 1   Trouble relaxing 0 1 1  Restless 0 1 0  Easily annoyed or irritable 1 1 1   Afraid - awful might happen 0 1 1  Total GAD 7 Score 3 7 6   Anxiety Difficulty Somewhat difficult - Somewhat difficult   Insomnia-  She did see Dr. Wynona Neatlalere with pulmonary on 08/13/18 for snoring, insomnia and witnessed apneas. Note reviewed. He felt she has a high probability of significant sleep disordered breathing and scheduled a home sleep study. She has not sleep study scheduled yet.  Hypothyroidism- takes synthroid 88 mcg daily.  Lab Results  Component Value Date   TSH 1.53 06/18/2018   Lab Results  Component Value Date   CHOL 210 (H) 06/18/2018   HDL 53.20 06/18/2018   LDLCALC 123 (H) 06/18/2018   TRIG 170.0 (H) 06/18/2018   CHOLHDL 4 06/18/2018  Current Outpatient Medications on File Prior to Visit  Medication Sig Dispense Refill  . ALPRAZolam (XANAX) 0.5 MG tablet   0  . Probiotic Product (PROBIOTIC DAILY PO) Take by mouth.    . sertraline (ZOLOFT) 50 MG tablet Take 1 tablet (50 mg total) by mouth daily. 90 tablet 0  . SYNTHROID 88 MCG tablet Take 1 tablet by mouth daily.  1  . traZODone (DESYREL) 50 MG tablet Take 0.5-1 tablets (25-50 mg total) by mouth at bedtime as needed for sleep. 30 tablet 3   No current facility-administered medications on file prior to visit.     Allergies  Allergen Reactions  . Sulfa Antibiotics     Past Medical History:  Diagnosis Date  . Anxiety   . Depression   . History of chicken pox   . History of diverticulosis 10/15/2014   Per Colonoscopy  . History of fibrocystic disease of breast   .  History of hemorrhoids   . Hyperlipidemia   . Hypertension   . Hypothyroidism     Past Surgical History:  Procedure Laterality Date  . ANAL FISSURE REPAIR  01/2010   Dr. Zachery Dakins  . COLONOSCOPY  10/15/2014   Diverticulosis/repeat 10 years/[facility] Dr. Caryl Never.    Family History  Problem Relation Age of Onset  . Diabetes Mother   . Hypertension Mother   . Stroke Mother   . Hypercholesterolemia Father   . Hypertension Father   . Heart disease Father   . Hypertension Brother   . Hypercholesterolemia Brother   . Heart disease Brother   . Stroke Maternal Grandmother   . Hypertension Maternal Grandmother   . Diabetes Maternal Grandmother   . Stroke Maternal Grandfather   . Hypertension Maternal Grandfather   . Diabetes Maternal Grandfather     Social History   Socioeconomic History  . Marital status: Married    Spouse name: Not on file  . Number of children: Not on file  . Years of education: Not on file  . Highest education level: Not on file  Occupational History  . Not on file  Social Needs  . Financial resource strain: Not on file  . Food insecurity:    Worry: Not on file    Inability: Not on file  . Transportation needs:    Medical: Not on file    Non-medical: Not on file  Tobacco Use  . Smoking status: Never Smoker  . Smokeless tobacco: Never Used  Substance and Sexual Activity  . Alcohol use: Not on file  . Drug use: Not on file  . Sexual activity: Not on file  Lifestyle  . Physical activity:    Days per week: Not on file    Minutes per session: Not on file  . Stress: Not on file  Relationships  . Social connections:    Talks on phone: Not on file    Gets together: Not on file    Attends religious service: Not on file    Active member of club or organization: Not on file    Attends meetings of clubs or organizations: Not on file    Relationship status: Not on file  . Intimate partner violence:    Fear of current or ex partner: Not on file     Emotionally abused: Not on file    Physically abused: Not on file    Forced sexual activity: Not on file  Other Topics Concern  . Not on file  Social History Narrative  .  Not on file   The PMH, PSH, Social History, Family History, Medications, and allergies have been reviewed in [facility], and have been updated if relevant.     Review of Systems  Constitutional: Negative.   HENT: Negative.   Eyes: Negative.   Respiratory: Negative.   Cardiovascular: Negative.   Gastrointestinal: Negative.   Endocrine: Negative.   Genitourinary: Negative.   Musculoskeletal: Negative.   Skin: Negative.   Allergic/Immunologic: Negative.   Neurological: Negative.   Hematological: Negative.   Psychiatric/Behavioral: Positive for dysphoric mood and sleep disturbance. Negative for behavioral problems, confusion, decreased concentration, hallucinations, self-injury and suicidal ideas. The patient is not nervous/anxious and is not hyperactive.   All other systems reviewed and are negative.      Objective:    BP 134/66 (BP Location: Left Arm, Patient Position: Sitting, Cuff Size: Normal)   Pulse 76   Temp 97.6 F (36.4 C) (Oral)   Ht 5\' 5"  (1.651 m)   Wt 193 lb 12.8 oz (87.9 kg)   SpO2 98%   BMI 32.25 kg/m    Physical Exam    General:  Well-developed,well-nourished,in no acute distress; alert,appropriate and cooperative throughout examination Head:  normocephalic and atraumatic.   Eyes:  vision grossly intact, PERRL Ears:  R ear normal and L ear normal externally, TMs clear bilaterally Nose:  no external deformity.   Mouth:  good dentition.   Neck:  No deformities, masses, or tenderness noted. Lungs:  Normal respiratory effort, chest expands symmetrically. Lungs are clear to auscultation, no crackles or wheezes. Heart:  Normal rate and regular rhythm. S1 and S2 normal without gallop, murmur, click, rub or other extra sounds. Abdomen:  Bowel sounds positive,abdomen soft and non-tender  without masses, organomegaly or hernias noted. Msk:  No deformity or scoliosis noted of thoracic or lumbar spine.   Extremities:  No clubbing, cyanosis, edema, or deformity noted with normal full range of motion of all joints.   Neurologic:  alert & oriented X3 and gait normal.   Skin:  Intact without suspicious lesions or rashes Psych:  Cognition and judgment appear intact. Alert and cooperative with normal attention span and concentration. No apparent delusions, illusions, hallucinations     Assessment & Plan:   Psychophysiological insomnia  Anxiety and depression  Hypothyroidism, unspecified type  Need for hepatitis C screening test - Plan: Hepatitis C Antibody No follow-ups on file.

## 2018-09-17 ENCOUNTER — Encounter: Payer: Self-pay | Admitting: Family Medicine

## 2018-09-17 ENCOUNTER — Ambulatory Visit: Payer: BLUE CROSS/BLUE SHIELD | Admitting: Family Medicine

## 2018-09-17 VITALS — BP 134/66 | HR 76 | Temp 97.6°F | Ht 65.0 in | Wt 193.8 lb

## 2018-09-17 DIAGNOSIS — F329 Major depressive disorder, single episode, unspecified: Secondary | ICD-10-CM

## 2018-09-17 DIAGNOSIS — R0683 Snoring: Secondary | ICD-10-CM

## 2018-09-17 DIAGNOSIS — F419 Anxiety disorder, unspecified: Secondary | ICD-10-CM | POA: Diagnosis not present

## 2018-09-17 DIAGNOSIS — F5104 Psychophysiologic insomnia: Secondary | ICD-10-CM

## 2018-09-17 DIAGNOSIS — E039 Hypothyroidism, unspecified: Secondary | ICD-10-CM

## 2018-09-17 DIAGNOSIS — F32A Depression, unspecified: Secondary | ICD-10-CM

## 2018-09-17 DIAGNOSIS — Z1159 Encounter for screening for other viral diseases: Secondary | ICD-10-CM | POA: Diagnosis not present

## 2018-09-17 LAB — TSH: TSH: 1.56 u[IU]/mL (ref 0.35–4.50)

## 2018-09-17 LAB — T4, FREE: Free T4: 0.84 ng/dL (ref 0.60–1.60)

## 2018-09-17 NOTE — Assessment & Plan Note (Signed)
Has been well controlled. She would like thyroid function repeated today. Labs ordered.

## 2018-09-17 NOTE — Assessment & Plan Note (Signed)
She feels lower dose of zoloft is working well- continue zoloft 50 mg daily. She will make an appointment to see Bambi.   Depression and anxiety screening has improved today.  Most of her positive answers, she related to situational work stressors.  [facility] 09/17/2018 08/12/2018 06/18/2018  Decreased Interest 1 2 0  Down, Depressed, Hopeless 1 1 1   PHQ - 2 Score 2 3 1   Altered sleeping 2 1 3   Tired, decreased energy 2 2 3   Change in appetite 0 2 2  Feeling bad or failure about yourself  1 1 0  Trouble concentrating 0 1 0  Moving slowly or fidgety/restless 0 0 0  Suicidal thoughts 0 0 0  PHQ-9 Score 7 10 9   Difficult doing work/chores Somewhat difficult - Somewhat difficult   GAD 7 : Generalized Anxiety Score 09/17/2018 08/12/2018 06/18/2018  Nervous, Anxious, on Edge 0 1 1  Control/stop worrying 1 1 1   Worry too much - different things 1 1 1   Trouble relaxing 0 1 1  Restless 0 1 0  Easily annoyed or irritable 1 1 1   Afraid - awful might happen 0 1 1  Total GAD 7 Score 3 7 6   Anxiety Difficulty Somewhat difficult - Somewhat difficult

## 2018-09-17 NOTE — Patient Instructions (Addendum)
Great to see you. I will call you with your lab results from today and you can view them online.    Please call pulmonary to follow up on your home sleep study.  Happy New Year!

## 2018-09-17 NOTE — Assessment & Plan Note (Signed)
Per pulmonary, home sleep study is being ordered as she has high probability of OSA.  I advised that she call them to follow up about this. The patient indicates understanding of these issues and agrees with the plan.  >25 minutes spent in face to face time with patient, >50% spent in counselling or coordination of care discussing anxiety, depression, insomnia, hypothyroidism.

## 2018-09-18 LAB — HEPATITIS C ANTIBODY
Hepatitis C Ab: NONREACTIVE
SIGNAL TO CUT-OFF: 0.01 (ref <1.00)

## 2018-10-14 DIAGNOSIS — R030 Elevated blood-pressure reading, without diagnosis of hypertension: Secondary | ICD-10-CM | POA: Diagnosis not present

## 2018-10-14 DIAGNOSIS — M545 Low back pain: Secondary | ICD-10-CM | POA: Diagnosis not present

## 2018-10-16 ENCOUNTER — Ambulatory Visit: Payer: Self-pay | Admitting: Family Medicine

## 2018-10-23 ENCOUNTER — Ambulatory Visit: Payer: Self-pay | Admitting: Family Medicine

## 2018-11-26 ENCOUNTER — Other Ambulatory Visit: Payer: Self-pay | Admitting: Family Medicine

## 2019-02-25 ENCOUNTER — Other Ambulatory Visit: Payer: Self-pay | Admitting: Family Medicine

## 2019-05-25 ENCOUNTER — Other Ambulatory Visit: Payer: Self-pay | Admitting: Family Medicine

## 2019-08-27 ENCOUNTER — Other Ambulatory Visit: Payer: Self-pay

## 2019-08-27 MED ORDER — SERTRALINE HCL 50 MG PO TABS: ORAL_TABLET | ORAL | 0 refills | Status: DC

## 2019-09-16 NOTE — Progress Notes (Signed)
Virtual Visit via Video   Due to the COVID-19 pandemic, this visit was completed with telemedicine (audio/video) technology to reduce patient and provider exposure as well as to preserve personal protective equipment.   I connected with Audrey Rogers by a video enabled telemedicine application and verified that I am speaking with the correct person using two identifiers. Location patient: Home Location provider:  [facility], Office Persons participating in the virtual visit: Colbi, Schiltz, MD   I discussed the limitations of evaluation and management by telemedicine and the availability of in person appointments. The patient expressed understanding and agreed to proceed.  Care Team   Patient Care Team: Lucille Passy, MD as PCP - General (Family Medicine)  Subjective:   HPI:  Follow up depression, anxiety, HLD, Hypothyroidism.  Review of Systems  Constitutional: Negative.  Negative for fever and malaise/fatigue.  HENT: Negative.  Negative for congestion and hearing loss.   Eyes: Negative.  Negative for blurred vision, discharge and redness.  Respiratory: Negative.  Negative for cough and shortness of breath.   Cardiovascular: Negative.  Negative for chest pain, palpitations and leg swelling.  Gastrointestinal: Negative.  Negative for abdominal pain and heartburn.  Genitourinary: Negative.  Negative for dysuria.  Musculoskeletal: Negative for falls.  Skin: Negative.  Negative for rash.  Neurological: Negative.  Negative for loss of consciousness and headaches.  Endo/Heme/Allergies: Negative.  Does not bruise/bleed easily.  Psychiatric/Behavioral: Negative.  Negative for depression.  All other systems reviewed and are negative.    Patient Active Problem List   Diagnosis Date Noted  . Anxiety and depression 06/18/2018  . Hypothyroidism 06/18/2018  . HLD (hyperlipidemia) 06/18/2018  . Insomnia 06/18/2018  . Snoring 06/18/2018    Social History    Tobacco Use  . Smoking status: Never Smoker  . Smokeless tobacco: Never Used  Substance Use Topics  . Alcohol use: Not on file    Current Outpatient Medications:  .  ALPRAZolam (XANAX) 0.5 MG tablet, Take 1 tablet (0.5 mg total) by mouth at bedtime as needed for anxiety., Disp: 10 tablet, Rfl: 0 .  sertraline (ZOLOFT) 50 MG tablet, Take 1qd (Plz sched phone visit for January), Disp: 90 tablet, Rfl: 0 .  SYNTHROID 88 MCG tablet, TAKE 1 TABLET BY MOUTH EVERY DAY, Disp: 90 tablet, Rfl: 2 .  traZODone (DESYREL) 50 MG tablet, Take 0.5-1 tablets (25-50 mg total) by mouth at bedtime as needed for sleep., Disp: 30 tablet, Rfl: 3 .  Probiotic Product (PROBIOTIC DAILY PO), Take by mouth., Disp: , Rfl:   Allergies  Allergen Reactions  . Sulfa Antibiotics     Objective:  BP 137/82   Pulse 97   Ht 5\' 5"  (1.651 m)   Wt 185 lb (83.9 kg)   BMI 30.79 kg/m   VITALS: Per patient if applicable, see vitals. GENERAL: Alert, appears well and in no acute distress. HEENT: Atraumatic, conjunctiva clear, no obvious abnormalities on inspection of external nose and ears. NECK: Normal movements of the head and neck. CARDIOPULMONARY: No increased WOB. Speaking in clear sentences. I:E ratio WNL.  MS: Moves all visible extremities without noticeable abnormality. PSYCH: Pleasant and cooperative, well-groomed. Speech normal rate and rhythm. Affect is appropriate. Insight and judgement are appropriate. Attention is focused, linear, and appropriate.  NEURO: CN grossly intact. Oriented as arrived to appointment on time with no prompting. Moves both UE equally.  SKIN: No obvious lesions, wounds, erythema, or cyanosis noted on face or hands.  Depression screen PHQ  2/9 09/17/2019 09/17/2018 08/12/2018  Decreased Interest 0 1 2  Down, Depressed, Hopeless 0 1 1  PHQ - 2 Score 0 2 3  Altered sleeping 1 2 1   Tired, decreased energy 1 2 2   Change in appetite 0 0 2  Feeling bad or failure about yourself  0 1 1  Trouble  concentrating 0 0 1  Moving slowly or fidgety/restless 0 0 0  Suicidal thoughts 0 0 0  PHQ-9 Score 2 7 10   Difficult doing work/chores Somewhat difficult Somewhat difficult -     . COVID-19 Education: The signs and symptoms of COVID-19 were discussed with the patient and how to seek care for testing if needed. The importance of social distancing was discussed today. . Reviewed expectations re: course of current medical issues. . Discussed self-management of symptoms. . Outlined signs and symptoms indicating need for more acute intervention. . Patient verbalized understanding and all questions were answered. Health Maintenance issues including appropriate healthy diet, exercise, and smoking avoidance were discussed with patient. . See orders for this visit as documented in the electronic medical record.  , MD  Records requested if needed. Time spent:25 minutes, of which >50% was spent in obtaining information about her symptoms, reviewing her previous labs, evaluations, and treatments, counseling her about her condition (please see the discussed topics above), and developing a plan to further investigate it; she had a number of questions which I addressed.   Lab Results  Component Value Date   WBC 8.8 01/09/2018   HGB 12.7 01/09/2018   HCT 38 01/09/2018   PLT 287 01/09/2018   GLUCOSE 79 06/18/2018   CHOL 210 (H) 06/18/2018   TRIG 170.0 (H) 06/18/2018   HDL 53.20 06/18/2018   LDLCALC 123 (H) 06/18/2018   ALT 16 06/18/2018   AST 15 06/18/2018   NA 138 06/18/2018   K 4.3 06/18/2018   CL 103 06/18/2018   CREATININE 0.72 06/18/2018   BUN 16 06/18/2018   CO2 30 06/18/2018   TSH 1.56 09/17/2018    Lab Results  Component Value Date   TSH 1.56 09/17/2018   Lab Results  Component Value Date   WBC 8.8 01/09/2018   HGB 12.7 01/09/2018   HCT 38 01/09/2018   MCV 96.0 01/16/2010   PLT 287 01/09/2018   Lab Results  Component Value Date   NA 138 06/18/2018   K 4.3  06/18/2018   CO2 30 06/18/2018   GLUCOSE 79 06/18/2018   BUN 16 06/18/2018   CREATININE 0.72 06/18/2018   BILITOT 0.3 06/18/2018   ALKPHOS 79 06/18/2018   AST 15 06/18/2018   ALT 16 06/18/2018   PROT 7.9 06/18/2018   ALBUMIN 4.4 06/18/2018   CALCIUM 10.0 06/18/2018   GFR 89.75 06/18/2018   Lab Results  Component Value Date   CHOL 210 (H) 06/18/2018   Lab Results  Component Value Date   HDL 53.20 06/18/2018   Lab Results  Component Value Date   LDLCALC 123 (H) 06/18/2018   Lab Results  Component Value Date   TRIG 170.0 (H) 06/18/2018   Lab Results  Component Value Date   CHOLHDL 4 06/18/2018   No results found for: HGBA1C     Assessment & Plan:   Problem List Items Addressed This Visit      Active Problems   Anxiety and depression    Assessment: Current symptoms:  No current suicidal and homicidal ideation. Side effects from treatment: none.  Plan: 1. Medications: Zoloft.  She is doing well on  50 mg tablet.   2. If she has any significant side effects to the medicine, she is to stop it and call for advice. Instructed patient to contact office or on-call physician promptly should condition worsen or any new symptoms appear.   GAD 7 : Generalized Anxiety Score 09/17/2019 09/17/2018 08/12/2018 06/18/2018  Nervous, Anxious, on Edge 0 0 1 1  Control/stop worrying 1 1 1 1   Worry too much - different things 0 1 1 1   Trouble relaxing 0 0 1 1  Restless 0 0 1 0  Easily annoyed or irritable 0 1 1 1   Afraid - awful might happen 0 0 1 1  Total GAD 7 Score 1 3 7 6   Anxiety Difficulty Not difficult at all Somewhat difficult - Somewhat difficult    Depression screen [facility] 2/9 09/17/2019 09/17/2018 08/12/2018 06/18/2018  Decreased Interest 0 1 2 0  Down, Depressed, Hopeless 0 1 1 1   PHQ - 2 Score 0 2 3 1   Altered sleeping 1 2 1 3   Tired, decreased energy 1 2 2 3   Change in appetite 0 0 2 2  Feeling bad or failure about yourself  0 1 1 0  Trouble concentrating 0 0 1 0  Moving  slowly or fidgety/restless 0 0 0 0  Suicidal thoughts 0 0 0 0  PHQ-9 Score 2 7 10 9   Difficult doing work/chores Somewhat difficult Somewhat difficult - Somewhat difficult          Relevant Medications   ALPRAZolam (XANAX) 0.5 MG tablet   Hypothyroidism - Primary    History: Currently taking synthroid 88 mcg daily. Current symptoms: none. Patient denies change in energy level, diarrhea, heat / cold intolerance, nervousness and palpitations. Symptoms have been well-controlled. Lab Results  Component Value Date   TSH 1.56 09/17/2018   TSH 1.53 06/18/2018   TSH 4.76 01/09/2018   Lab Results  Component Value Date   FREET4 0.84 09/17/2018   FREET4 0.86 06/18/2018    Assessment/Plan: Due for labs.  Instructed not to take MVM or iron within 4 hours of taking thyroid medications.  Common Reasons for Abnormal TSH Levels on a Previously Stable Dosage of Thyroid Hormone . Patient nonadherent to thyroid hormone regimen (missing doses) . Decreased absorption of thyroid hormone . Patient is now taking thyroid hormone with food . Patient takes thyroid hormone within four hours of calcium, iron, soy products, or aluminum-containing antacids . Patient is prescribed medication that decreases absorption of thyroid hormone, such as cholestyramine (Questran), colestipol (Colestid), orlistat (Xenical), or sucralfate (Carafate) . Patient is now pregnant or recently started or stopped estrogen-containing oral contraceptive or hormone therapy . Generic substitution for brand name or vice versa, or substitution of one generic formulation for another . Patient started on sertraline (Zoloft), another selective serotonin reuptake inhibitor, or a tricyclic antidepressant . Patient started on carbamazepine (Tegretol) or phenytoin (Dilantin)          Relevant Orders   T4, free   T3   TSH   HLD (hyperlipidemia)    H/o HLD- not current on rx.       Relevant Orders   Lipid panel   Comprehensive  metabolic panel   CBC with Differential/Platelet      I have changed Heidy L. Wearing's ALPRAZolam. I am also having her maintain her traZODone, Probiotic Product (PROBIOTIC DAILY PO), Synthroid, and sertraline.  Meds ordered this encounter  Medications  . ALPRAZolam (XANAX) 0.5 MG tablet  Sig: Take 1 tablet (0.5 mg total) by mouth at bedtime as needed for anxiety.    Dispense:  10 tablet    Refill:  0     Ruthe Mannan, MD

## 2019-09-16 NOTE — Assessment & Plan Note (Signed)
History: Currently taking synthroid 88 mcg daily. Current symptoms: none. Patient denies change in energy level, diarrhea, heat / cold intolerance, nervousness and palpitations. Symptoms have been well-controlled. Lab Results  Component Value Date   TSH 1.56 09/17/2018   TSH 1.53 06/18/2018   TSH 4.76 01/09/2018   Lab Results  Component Value Date   FREET4 0.84 09/17/2018   FREET4 0.86 06/18/2018    Assessment/Plan: Due for labs.  Instructed not to take MVM or iron within 4 hours of taking thyroid medications.  Common Reasons for Abnormal TSH Levels on a Previously Stable Dosage of Thyroid Hormone . Patient nonadherent to thyroid hormone regimen (missing doses) . Decreased absorption of thyroid hormone . Patient is now taking thyroid hormone with food . Patient takes thyroid hormone within four hours of calcium, iron, soy products, or aluminum-containing antacids . Patient is prescribed medication that decreases absorption of thyroid hormone, such as cholestyramine (Questran), colestipol (Colestid), orlistat (Xenical), or sucralfate (Carafate) . Patient is now pregnant or recently started or stopped estrogen-containing oral contraceptive or hormone therapy . Generic substitution for brand name or vice versa, or substitution of one generic formulation for another . Patient started on sertraline (Zoloft), another selective serotonin reuptake inhibitor, or a tricyclic antidepressant . Patient started on carbamazepine (Tegretol) or phenytoin (Dilantin)

## 2019-09-16 NOTE — Assessment & Plan Note (Addendum)
Assessment: Current symptoms:  No current suicidal and homicidal ideation. Side effects from treatment: none.  Plan: 1. Medications: Zoloft.  She is doing well on  50 mg tablet.  Uses xanax so infrequently that she is asking for only 10 tablets on her refill. 2. If she has any significant side effects to the medicine, she is to stop it and call for advice. Instructed patient to contact office or on-call physician promptly should condition worsen or any new symptoms appear.   GAD 7 : Generalized Anxiety Score 09/17/2019 09/17/2018 08/12/2018 06/18/2018  Nervous, Anxious, on Edge 0 0 1 1  Control/stop worrying 1 1 1 1   Worry too much - different things 0 1 1 1   Trouble relaxing 0 0 1 1  Restless 0 0 1 0  Easily annoyed or irritable 0 1 1 1   Afraid - awful might happen 0 0 1 1  Total GAD 7 Score 1 3 7 6   Anxiety Difficulty Not difficult at all Somewhat difficult - Somewhat difficult    Depression screen [facility] 2/9 09/17/2019 09/17/2018 08/12/2018 06/18/2018  Decreased Interest 0 1 2 0  Down, Depressed, Hopeless 0 1 1 1   PHQ - 2 Score 0 2 3 1   Altered sleeping 1 2 1 3   Tired, decreased energy 1 2 2 3   Change in appetite 0 0 2 2  Feeling bad or failure about yourself  0 1 1 0  Trouble concentrating 0 0 1 0  Moving slowly or fidgety/restless 0 0 0 0  Suicidal thoughts 0 0 0 0  PHQ-9 Score 2 7 10 9   Difficult doing work/chores Somewhat difficult Somewhat difficult - Somewhat difficult

## 2019-09-17 ENCOUNTER — Telehealth (INDEPENDENT_AMBULATORY_CARE_PROVIDER_SITE_OTHER): Payer: BC Managed Care – PPO | Admitting: Family Medicine

## 2019-09-17 ENCOUNTER — Encounter: Payer: Self-pay | Admitting: Family Medicine

## 2019-09-17 VITALS — BP 137/82 | HR 97 | Ht 65.0 in | Wt 185.0 lb

## 2019-09-17 DIAGNOSIS — E039 Hypothyroidism, unspecified: Secondary | ICD-10-CM | POA: Diagnosis not present

## 2019-09-17 DIAGNOSIS — E785 Hyperlipidemia, unspecified: Secondary | ICD-10-CM | POA: Diagnosis not present

## 2019-09-17 DIAGNOSIS — F419 Anxiety disorder, unspecified: Secondary | ICD-10-CM | POA: Diagnosis not present

## 2019-09-17 DIAGNOSIS — F329 Major depressive disorder, single episode, unspecified: Secondary | ICD-10-CM | POA: Diagnosis not present

## 2019-09-17 DIAGNOSIS — F32A Depression, unspecified: Secondary | ICD-10-CM

## 2019-09-17 MED ORDER — ALPRAZOLAM 0.5 MG PO TABS: 0.5000 mg | ORAL_TABLET | Freq: Every evening | ORAL | 0 refills | Status: AC | PRN

## 2019-09-17 NOTE — Assessment & Plan Note (Addendum)
H/o HLD- not current on rx.  Low ascvd risk score. Check lipid panel. The 10-year ASCVD risk score Denman George DC Montez Hageman., et al., 2013) is: 2.5%   Values used to calculate the score:     Age: 54 years     Sex: Female     Is Non-Hispanic African American: No     Diabetic: No     Tobacco smoker: No     Systolic Blood Pressure: 137 mmHg     Is BP treated: No     HDL Cholesterol: 53.2 mg/dL     Total Cholesterol: 210 mg/dL

## 2019-09-21 ENCOUNTER — Other Ambulatory Visit: Payer: Self-pay

## 2019-09-22 ENCOUNTER — Other Ambulatory Visit (INDEPENDENT_AMBULATORY_CARE_PROVIDER_SITE_OTHER): Payer: BC Managed Care – PPO

## 2019-09-22 DIAGNOSIS — E039 Hypothyroidism, unspecified: Secondary | ICD-10-CM

## 2019-09-22 DIAGNOSIS — E785 Hyperlipidemia, unspecified: Secondary | ICD-10-CM

## 2019-09-22 LAB — CBC WITH DIFFERENTIAL/PLATELET
Basophils Absolute: 0.1 10*3/uL (ref 0.0–0.1)
Basophils Relative: 1 % (ref 0.0–3.0)
Eosinophils Absolute: 0.2 10*3/uL (ref 0.0–0.7)
Eosinophils Relative: 2.1 % (ref 0.0–5.0)
HCT: 40.3 % (ref 36.0–46.0)
Hemoglobin: 13.3 g/dL (ref 12.0–15.0)
Lymphocytes Relative: 35.6 % (ref 12.0–46.0)
Lymphs Abs: 2.7 10*3/uL (ref 0.7–4.0)
MCHC: 33 g/dL (ref 30.0–36.0)
MCV: 89.9 fl (ref 78.0–100.0)
Monocytes Absolute: 0.5 10*3/uL (ref 0.1–1.0)
Monocytes Relative: 6.6 % (ref 3.0–12.0)
Neutro Abs: 4.1 10*3/uL (ref 1.4–7.7)
Neutrophils Relative %: 54.7 % (ref 43.0–77.0)
Platelets: 280 10*3/uL (ref 150.0–400.0)
RBC: 4.48 Mil/uL (ref 3.87–5.11)
RDW: 14 % (ref 11.5–15.5)
WBC: 7.5 10*3/uL (ref 4.0–10.5)

## 2019-09-22 LAB — COMPREHENSIVE METABOLIC PANEL
ALT: 17 U/L (ref 0–35)
AST: 19 U/L (ref 0–37)
Albumin: 4.5 g/dL (ref 3.5–5.2)
Alkaline Phosphatase: 92 U/L (ref 39–117)
BUN: 19 mg/dL (ref 6–23)
CO2: 29 mEq/L (ref 19–32)
Calcium: 9.9 mg/dL (ref 8.4–10.5)
Chloride: 101 mEq/L (ref 96–112)
Creatinine, Ser: 0.82 mg/dL (ref 0.40–1.20)
GFR: 72.34 mL/min (ref >60.00)
Glucose, Bld: 92 mg/dL (ref 70–99)
Potassium: 4.2 mEq/L (ref 3.5–5.1)
Sodium: 140 mEq/L (ref 135–145)
Total Bilirubin: 0.6 mg/dL (ref 0.2–1.2)
Total Protein: 7.6 g/dL (ref 6.0–8.3)

## 2019-09-22 LAB — LIPID PANEL
Cholesterol: 201 mg/dL — ABNORMAL HIGH (ref 0–200)
HDL: 50.5 mg/dL (ref >39.00)
LDL Cholesterol: 114 mg/dL — ABNORMAL HIGH (ref 0–99)
NonHDL: 150.1
Total CHOL/HDL Ratio: 4
Triglycerides: 182 mg/dL — ABNORMAL HIGH (ref 0.0–149.0)
VLDL: 36.4 mg/dL (ref 0.0–40.0)

## 2019-09-22 LAB — T4, FREE: Free T4: 1.42 ng/dL (ref 0.60–1.60)

## 2019-09-22 LAB — TSH: TSH: 1.9 u[IU]/mL (ref 0.35–4.50)

## 2019-09-23 LAB — T3: T3, Total: 111 ng/dL (ref 76–181)

## 2019-10-19 ENCOUNTER — Other Ambulatory Visit: Payer: Self-pay | Admitting: Family Medicine

## 2019-10-19 DIAGNOSIS — Z1231 Encounter for screening mammogram for malignant neoplasm of breast: Secondary | ICD-10-CM

## 2019-10-21 DIAGNOSIS — M5441 Lumbago with sciatica, right side: Secondary | ICD-10-CM | POA: Diagnosis not present

## 2019-10-21 DIAGNOSIS — M9901 Segmental and somatic dysfunction of cervical region: Secondary | ICD-10-CM | POA: Diagnosis not present

## 2019-10-21 DIAGNOSIS — M5412 Radiculopathy, cervical region: Secondary | ICD-10-CM | POA: Diagnosis not present

## 2019-10-21 DIAGNOSIS — M9903 Segmental and somatic dysfunction of lumbar region: Secondary | ICD-10-CM | POA: Diagnosis not present

## 2019-10-23 DIAGNOSIS — M5441 Lumbago with sciatica, right side: Secondary | ICD-10-CM | POA: Diagnosis not present

## 2019-10-23 DIAGNOSIS — M9903 Segmental and somatic dysfunction of lumbar region: Secondary | ICD-10-CM | POA: Diagnosis not present

## 2019-10-23 DIAGNOSIS — M9901 Segmental and somatic dysfunction of cervical region: Secondary | ICD-10-CM | POA: Diagnosis not present

## 2019-10-23 DIAGNOSIS — M5412 Radiculopathy, cervical region: Secondary | ICD-10-CM | POA: Diagnosis not present

## 2019-10-27 DIAGNOSIS — M9901 Segmental and somatic dysfunction of cervical region: Secondary | ICD-10-CM | POA: Diagnosis not present

## 2019-10-27 DIAGNOSIS — M5412 Radiculopathy, cervical region: Secondary | ICD-10-CM | POA: Diagnosis not present

## 2019-10-27 DIAGNOSIS — M9903 Segmental and somatic dysfunction of lumbar region: Secondary | ICD-10-CM | POA: Diagnosis not present

## 2019-10-27 DIAGNOSIS — M5441 Lumbago with sciatica, right side: Secondary | ICD-10-CM | POA: Diagnosis not present

## 2019-10-30 DIAGNOSIS — M9903 Segmental and somatic dysfunction of lumbar region: Secondary | ICD-10-CM | POA: Diagnosis not present

## 2019-10-30 DIAGNOSIS — M9901 Segmental and somatic dysfunction of cervical region: Secondary | ICD-10-CM | POA: Diagnosis not present

## 2019-10-30 DIAGNOSIS — M5412 Radiculopathy, cervical region: Secondary | ICD-10-CM | POA: Diagnosis not present

## 2019-10-30 DIAGNOSIS — M5441 Lumbago with sciatica, right side: Secondary | ICD-10-CM | POA: Diagnosis not present

## 2019-11-02 DIAGNOSIS — M9903 Segmental and somatic dysfunction of lumbar region: Secondary | ICD-10-CM | POA: Diagnosis not present

## 2019-11-02 DIAGNOSIS — M5412 Radiculopathy, cervical region: Secondary | ICD-10-CM | POA: Diagnosis not present

## 2019-11-02 DIAGNOSIS — M9901 Segmental and somatic dysfunction of cervical region: Secondary | ICD-10-CM | POA: Diagnosis not present

## 2019-11-02 DIAGNOSIS — M5441 Lumbago with sciatica, right side: Secondary | ICD-10-CM | POA: Diagnosis not present

## 2019-11-05 DIAGNOSIS — M5441 Lumbago with sciatica, right side: Secondary | ICD-10-CM | POA: Diagnosis not present

## 2019-11-05 DIAGNOSIS — M9901 Segmental and somatic dysfunction of cervical region: Secondary | ICD-10-CM | POA: Diagnosis not present

## 2019-11-05 DIAGNOSIS — M5412 Radiculopathy, cervical region: Secondary | ICD-10-CM | POA: Diagnosis not present

## 2019-11-05 DIAGNOSIS — M9903 Segmental and somatic dysfunction of lumbar region: Secondary | ICD-10-CM | POA: Diagnosis not present

## 2019-11-10 DIAGNOSIS — M9901 Segmental and somatic dysfunction of cervical region: Secondary | ICD-10-CM | POA: Diagnosis not present

## 2019-11-10 DIAGNOSIS — M5412 Radiculopathy, cervical region: Secondary | ICD-10-CM | POA: Diagnosis not present

## 2019-11-10 DIAGNOSIS — M9903 Segmental and somatic dysfunction of lumbar region: Secondary | ICD-10-CM | POA: Diagnosis not present

## 2019-11-10 DIAGNOSIS — M5441 Lumbago with sciatica, right side: Secondary | ICD-10-CM | POA: Diagnosis not present

## 2019-11-12 DIAGNOSIS — M5441 Lumbago with sciatica, right side: Secondary | ICD-10-CM | POA: Diagnosis not present

## 2019-11-12 DIAGNOSIS — M9903 Segmental and somatic dysfunction of lumbar region: Secondary | ICD-10-CM | POA: Diagnosis not present

## 2019-11-12 DIAGNOSIS — M9901 Segmental and somatic dysfunction of cervical region: Secondary | ICD-10-CM | POA: Diagnosis not present

## 2019-11-12 DIAGNOSIS — M5412 Radiculopathy, cervical region: Secondary | ICD-10-CM | POA: Diagnosis not present

## 2019-11-16 DIAGNOSIS — M5412 Radiculopathy, cervical region: Secondary | ICD-10-CM | POA: Diagnosis not present

## 2019-11-16 DIAGNOSIS — M9901 Segmental and somatic dysfunction of cervical region: Secondary | ICD-10-CM | POA: Diagnosis not present

## 2019-11-16 DIAGNOSIS — M9903 Segmental and somatic dysfunction of lumbar region: Secondary | ICD-10-CM | POA: Diagnosis not present

## 2019-11-16 DIAGNOSIS — M5441 Lumbago with sciatica, right side: Secondary | ICD-10-CM | POA: Diagnosis not present

## 2019-11-19 DIAGNOSIS — M5441 Lumbago with sciatica, right side: Secondary | ICD-10-CM | POA: Diagnosis not present

## 2019-11-19 DIAGNOSIS — M9901 Segmental and somatic dysfunction of cervical region: Secondary | ICD-10-CM | POA: Diagnosis not present

## 2019-11-19 DIAGNOSIS — M9903 Segmental and somatic dysfunction of lumbar region: Secondary | ICD-10-CM | POA: Diagnosis not present

## 2019-11-19 DIAGNOSIS — M5412 Radiculopathy, cervical region: Secondary | ICD-10-CM | POA: Diagnosis not present

## 2019-11-23 DIAGNOSIS — M5412 Radiculopathy, cervical region: Secondary | ICD-10-CM | POA: Diagnosis not present

## 2019-11-23 DIAGNOSIS — M5441 Lumbago with sciatica, right side: Secondary | ICD-10-CM | POA: Diagnosis not present

## 2019-11-23 DIAGNOSIS — M9903 Segmental and somatic dysfunction of lumbar region: Secondary | ICD-10-CM | POA: Diagnosis not present

## 2019-11-23 DIAGNOSIS — M9901 Segmental and somatic dysfunction of cervical region: Secondary | ICD-10-CM | POA: Diagnosis not present

## 2019-11-24 ENCOUNTER — Ambulatory Visit
Admission: RE | Admit: 2019-11-24 | Discharge: 2019-11-24 | Disposition: A | Discharge status: Home / Self Care | Payer: BC Managed Care – PPO | Source: Ambulatory Visit | Attending: Family Medicine | Admitting: Family Medicine

## 2019-11-24 ENCOUNTER — Other Ambulatory Visit: Payer: Self-pay

## 2019-11-24 DIAGNOSIS — Z1231 Encounter for screening mammogram for malignant neoplasm of breast: Secondary | ICD-10-CM

## 2019-11-24 MED ORDER — SYNTHROID 88 MCG PO TABS: 88.0000 ug | ORAL_TABLET | Freq: Every day | ORAL | 0 refills | Status: DC

## 2019-11-24 MED ORDER — SERTRALINE HCL 50 MG PO TABS: ORAL_TABLET | ORAL | 0 refills | Status: DC

## 2019-11-26 DIAGNOSIS — M5412 Radiculopathy, cervical region: Secondary | ICD-10-CM | POA: Diagnosis not present

## 2019-11-26 DIAGNOSIS — M5441 Lumbago with sciatica, right side: Secondary | ICD-10-CM | POA: Diagnosis not present

## 2019-11-26 DIAGNOSIS — M9901 Segmental and somatic dysfunction of cervical region: Secondary | ICD-10-CM | POA: Diagnosis not present

## 2019-11-26 DIAGNOSIS — M9903 Segmental and somatic dysfunction of lumbar region: Secondary | ICD-10-CM | POA: Diagnosis not present

## 2019-11-30 DIAGNOSIS — M5441 Lumbago with sciatica, right side: Secondary | ICD-10-CM | POA: Diagnosis not present

## 2019-11-30 DIAGNOSIS — M9901 Segmental and somatic dysfunction of cervical region: Secondary | ICD-10-CM | POA: Diagnosis not present

## 2019-11-30 DIAGNOSIS — M5412 Radiculopathy, cervical region: Secondary | ICD-10-CM | POA: Diagnosis not present

## 2019-11-30 DIAGNOSIS — M9903 Segmental and somatic dysfunction of lumbar region: Secondary | ICD-10-CM | POA: Diagnosis not present

## 2019-12-03 DIAGNOSIS — M9901 Segmental and somatic dysfunction of cervical region: Secondary | ICD-10-CM | POA: Diagnosis not present

## 2019-12-03 DIAGNOSIS — M5412 Radiculopathy, cervical region: Secondary | ICD-10-CM | POA: Diagnosis not present

## 2019-12-03 DIAGNOSIS — M9903 Segmental and somatic dysfunction of lumbar region: Secondary | ICD-10-CM | POA: Diagnosis not present

## 2019-12-03 DIAGNOSIS — M5441 Lumbago with sciatica, right side: Secondary | ICD-10-CM | POA: Diagnosis not present

## 2019-12-08 DIAGNOSIS — M5441 Lumbago with sciatica, right side: Secondary | ICD-10-CM | POA: Diagnosis not present

## 2019-12-08 DIAGNOSIS — M5412 Radiculopathy, cervical region: Secondary | ICD-10-CM | POA: Diagnosis not present

## 2019-12-08 DIAGNOSIS — M9903 Segmental and somatic dysfunction of lumbar region: Secondary | ICD-10-CM | POA: Diagnosis not present

## 2019-12-08 DIAGNOSIS — M9901 Segmental and somatic dysfunction of cervical region: Secondary | ICD-10-CM | POA: Diagnosis not present

## 2019-12-15 DIAGNOSIS — M9903 Segmental and somatic dysfunction of lumbar region: Secondary | ICD-10-CM | POA: Diagnosis not present

## 2019-12-15 DIAGNOSIS — M9901 Segmental and somatic dysfunction of cervical region: Secondary | ICD-10-CM | POA: Diagnosis not present

## 2019-12-15 DIAGNOSIS — M5441 Lumbago with sciatica, right side: Secondary | ICD-10-CM | POA: Diagnosis not present

## 2019-12-15 DIAGNOSIS — M5412 Radiculopathy, cervical region: Secondary | ICD-10-CM | POA: Diagnosis not present

## 2020-02-25 ENCOUNTER — Other Ambulatory Visit: Payer: Self-pay | Admitting: Family Medicine

## 2020-02-26 DIAGNOSIS — M545 Low back pain: Secondary | ICD-10-CM | POA: Diagnosis not present

## 2020-02-26 NOTE — Telephone Encounter (Signed)
Last OV 01/074/21 Last fill 11/24/19 #90/0

## 2020-02-26 NOTE — Telephone Encounter (Signed)
Called patient to see about scheduling appointment.

## 2020-03-02 DIAGNOSIS — L814 Other melanin hyperpigmentation: Secondary | ICD-10-CM | POA: Diagnosis not present

## 2020-03-02 DIAGNOSIS — D225 Melanocytic nevi of trunk: Secondary | ICD-10-CM | POA: Diagnosis not present

## 2020-03-02 DIAGNOSIS — L570 Actinic keratosis: Secondary | ICD-10-CM | POA: Diagnosis not present

## 2020-03-02 DIAGNOSIS — L820 Inflamed seborrheic keratosis: Secondary | ICD-10-CM | POA: Diagnosis not present

## 2020-03-02 DIAGNOSIS — L578 Other skin changes due to chronic exposure to nonionizing radiation: Secondary | ICD-10-CM | POA: Diagnosis not present

## 2020-03-02 DIAGNOSIS — D2271 Melanocytic nevi of right lower limb, including hip: Secondary | ICD-10-CM | POA: Diagnosis not present

## 2020-03-07 DIAGNOSIS — E039 Hypothyroidism, unspecified: Secondary | ICD-10-CM | POA: Diagnosis not present

## 2020-03-07 DIAGNOSIS — E782 Mixed hyperlipidemia: Secondary | ICD-10-CM | POA: Diagnosis not present

## 2020-03-07 DIAGNOSIS — Z23 Encounter for immunization: Secondary | ICD-10-CM | POA: Diagnosis not present

## 2020-03-07 DIAGNOSIS — F419 Anxiety disorder, unspecified: Secondary | ICD-10-CM | POA: Diagnosis not present

## 2020-03-08 ENCOUNTER — Telehealth: Payer: Self-pay | Admitting: General Practice

## 2020-03-08 NOTE — Telephone Encounter (Signed)
Patient called back and stated that she is going to go to another office possibly outside of [facility].

## 2020-03-09 DIAGNOSIS — M545 Low back pain: Secondary | ICD-10-CM | POA: Diagnosis not present

## 2020-03-09 DIAGNOSIS — R269 Unspecified abnormalities of gait and mobility: Secondary | ICD-10-CM | POA: Diagnosis not present

## 2020-03-15 DIAGNOSIS — R269 Unspecified abnormalities of gait and mobility: Secondary | ICD-10-CM | POA: Diagnosis not present

## 2020-03-15 DIAGNOSIS — M545 Low back pain: Secondary | ICD-10-CM | POA: Diagnosis not present

## 2020-03-17 DIAGNOSIS — M545 Low back pain: Secondary | ICD-10-CM | POA: Diagnosis not present

## 2020-03-17 DIAGNOSIS — R269 Unspecified abnormalities of gait and mobility: Secondary | ICD-10-CM | POA: Diagnosis not present

## 2020-03-21 DIAGNOSIS — M47816 Spondylosis without myelopathy or radiculopathy, lumbar region: Secondary | ICD-10-CM | POA: Diagnosis not present

## 2020-03-24 DIAGNOSIS — R269 Unspecified abnormalities of gait and mobility: Secondary | ICD-10-CM | POA: Diagnosis not present

## 2020-03-24 DIAGNOSIS — M545 Low back pain: Secondary | ICD-10-CM | POA: Diagnosis not present

## 2020-03-30 DIAGNOSIS — M545 Low back pain: Secondary | ICD-10-CM | POA: Diagnosis not present

## 2020-03-30 DIAGNOSIS — R269 Unspecified abnormalities of gait and mobility: Secondary | ICD-10-CM | POA: Diagnosis not present

## 2020-04-07 DIAGNOSIS — M545 Low back pain: Secondary | ICD-10-CM | POA: Diagnosis not present

## 2020-04-07 DIAGNOSIS — R269 Unspecified abnormalities of gait and mobility: Secondary | ICD-10-CM | POA: Diagnosis not present

## 2020-04-12 DIAGNOSIS — M47816 Spondylosis without myelopathy or radiculopathy, lumbar region: Secondary | ICD-10-CM | POA: Diagnosis not present

## 2020-05-03 DIAGNOSIS — M47816 Spondylosis without myelopathy or radiculopathy, lumbar region: Secondary | ICD-10-CM | POA: Diagnosis not present

## 2020-05-24 ENCOUNTER — Other Ambulatory Visit: Payer: Self-pay | Admitting: Family Medicine

## 2020-05-29 ENCOUNTER — Other Ambulatory Visit: Payer: Self-pay | Admitting: Family Medicine

## 2020-07-02 DIAGNOSIS — S300XXA Contusion of lower back and pelvis, initial encounter: Secondary | ICD-10-CM | POA: Diagnosis not present

## 2020-07-08 DIAGNOSIS — M546 Pain in thoracic spine: Secondary | ICD-10-CM | POA: Diagnosis not present

## 2020-07-11 DIAGNOSIS — S22000A Wedge compression fracture of unspecified thoracic vertebra, initial encounter for closed fracture: Secondary | ICD-10-CM | POA: Diagnosis not present

## 2020-07-25 DIAGNOSIS — M546 Pain in thoracic spine: Secondary | ICD-10-CM | POA: Diagnosis not present

## 2020-08-08 DIAGNOSIS — M4696 Unspecified inflammatory spondylopathy, lumbar region: Secondary | ICD-10-CM | POA: Diagnosis not present

## 2020-08-23 DIAGNOSIS — M47816 Spondylosis without myelopathy or radiculopathy, lumbar region: Secondary | ICD-10-CM | POA: Diagnosis not present

## 2020-09-23 DIAGNOSIS — M47816 Spondylosis without myelopathy or radiculopathy, lumbar region: Secondary | ICD-10-CM | POA: Diagnosis not present

## 2020-10-10 DIAGNOSIS — E039 Hypothyroidism, unspecified: Secondary | ICD-10-CM | POA: Diagnosis not present

## 2020-10-10 DIAGNOSIS — Z0000 Encounter for general adult medical examination without abnormal findings: Secondary | ICD-10-CM | POA: Diagnosis not present

## 2020-10-10 DIAGNOSIS — F419 Anxiety disorder, unspecified: Secondary | ICD-10-CM | POA: Diagnosis not present

## 2020-10-10 DIAGNOSIS — E782 Mixed hyperlipidemia: Secondary | ICD-10-CM | POA: Diagnosis not present

## 2020-10-10 DIAGNOSIS — K219 Gastro-esophageal reflux disease without esophagitis: Secondary | ICD-10-CM | POA: Diagnosis not present

## 2020-10-21 DIAGNOSIS — Z23 Encounter for immunization: Secondary | ICD-10-CM | POA: Diagnosis not present

## 2020-11-02 DIAGNOSIS — J019 Acute sinusitis, unspecified: Secondary | ICD-10-CM | POA: Diagnosis not present

## 2020-12-05 ENCOUNTER — Other Ambulatory Visit: Payer: Self-pay | Admitting: Family Medicine

## 2020-12-05 DIAGNOSIS — Z1231 Encounter for screening mammogram for malignant neoplasm of breast: Secondary | ICD-10-CM

## 2021-01-18 DIAGNOSIS — M545 Low back pain, unspecified: Secondary | ICD-10-CM | POA: Diagnosis not present

## 2021-01-18 DIAGNOSIS — M62838 Other muscle spasm: Secondary | ICD-10-CM | POA: Diagnosis not present

## 2021-01-18 DIAGNOSIS — R399 Unspecified symptoms and signs involving the genitourinary system: Secondary | ICD-10-CM | POA: Diagnosis not present

## 2021-01-25 ENCOUNTER — Ambulatory Visit
Admission: RE | Admit: 2021-01-25 | Discharge: 2021-01-25 | Disposition: A | Discharge status: Home / Self Care | Payer: BC Managed Care – PPO | Source: Ambulatory Visit | Attending: Family Medicine | Admitting: Family Medicine

## 2021-01-25 ENCOUNTER — Other Ambulatory Visit: Payer: Self-pay

## 2021-01-25 DIAGNOSIS — Z1231 Encounter for screening mammogram for malignant neoplasm of breast: Secondary | ICD-10-CM | POA: Diagnosis not present

## 2021-03-02 DIAGNOSIS — D2271 Melanocytic nevi of right lower limb, including hip: Secondary | ICD-10-CM | POA: Diagnosis not present

## 2021-03-02 DIAGNOSIS — D225 Melanocytic nevi of trunk: Secondary | ICD-10-CM | POA: Diagnosis not present

## 2021-03-02 DIAGNOSIS — L570 Actinic keratosis: Secondary | ICD-10-CM | POA: Diagnosis not present

## 2021-03-02 DIAGNOSIS — L814 Other melanin hyperpigmentation: Secondary | ICD-10-CM | POA: Diagnosis not present

## 2021-03-02 DIAGNOSIS — L578 Other skin changes due to chronic exposure to nonionizing radiation: Secondary | ICD-10-CM | POA: Diagnosis not present

## 2021-04-03 DIAGNOSIS — E782 Mixed hyperlipidemia: Secondary | ICD-10-CM | POA: Diagnosis not present

## 2021-04-03 DIAGNOSIS — E039 Hypothyroidism, unspecified: Secondary | ICD-10-CM | POA: Diagnosis not present

## 2021-04-03 DIAGNOSIS — F419 Anxiety disorder, unspecified: Secondary | ICD-10-CM | POA: Diagnosis not present

## 2021-04-03 DIAGNOSIS — Z23 Encounter for immunization: Secondary | ICD-10-CM | POA: Diagnosis not present

## 2021-10-01 IMAGING — MG DIGITAL SCREENING BILAT W/ TOMO W/ CAD
8 series · 8 of 24 positions shown · non-contrast
Comparison: Previous exam(s).

CLINICAL DATA: Screening.

EXAM:
DIGITAL SCREENING BILATERAL MAMMOGRAM WITH TOMO AND CAD

[L CC synth-2D]
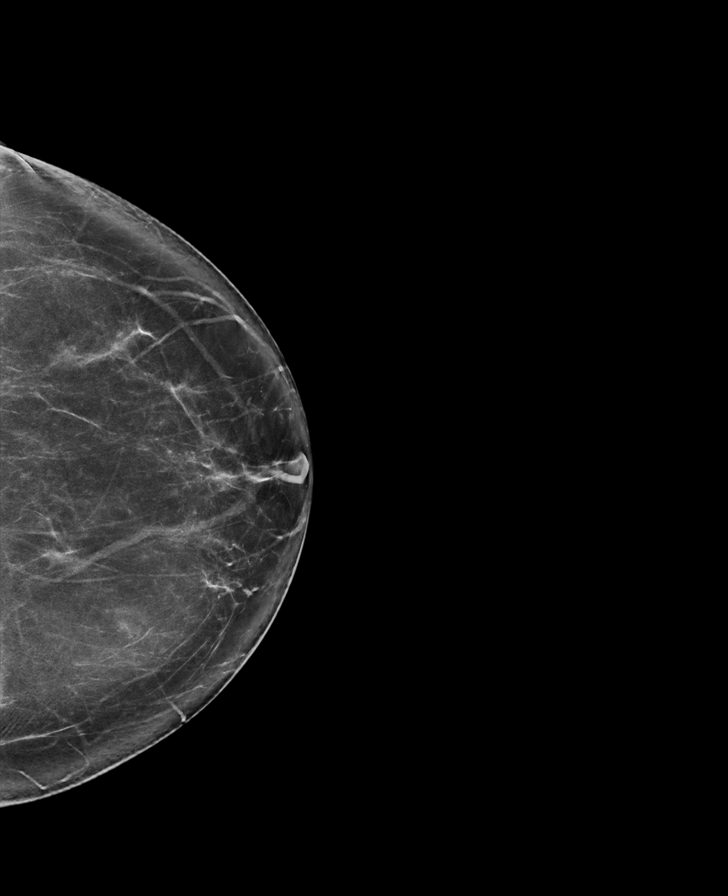

[L MLO synth-2D]
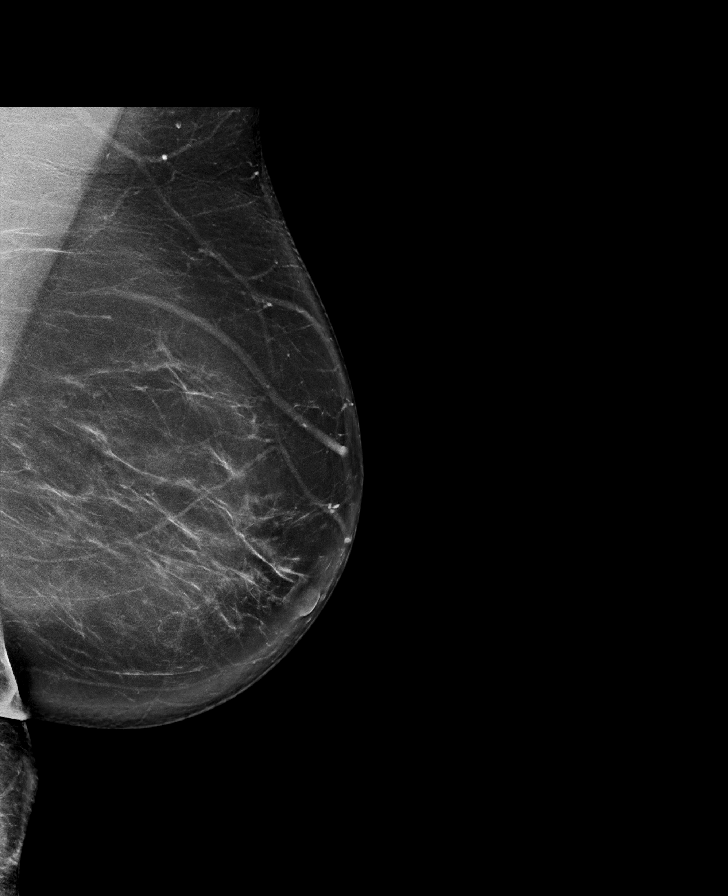

[R MLO synth-2D]
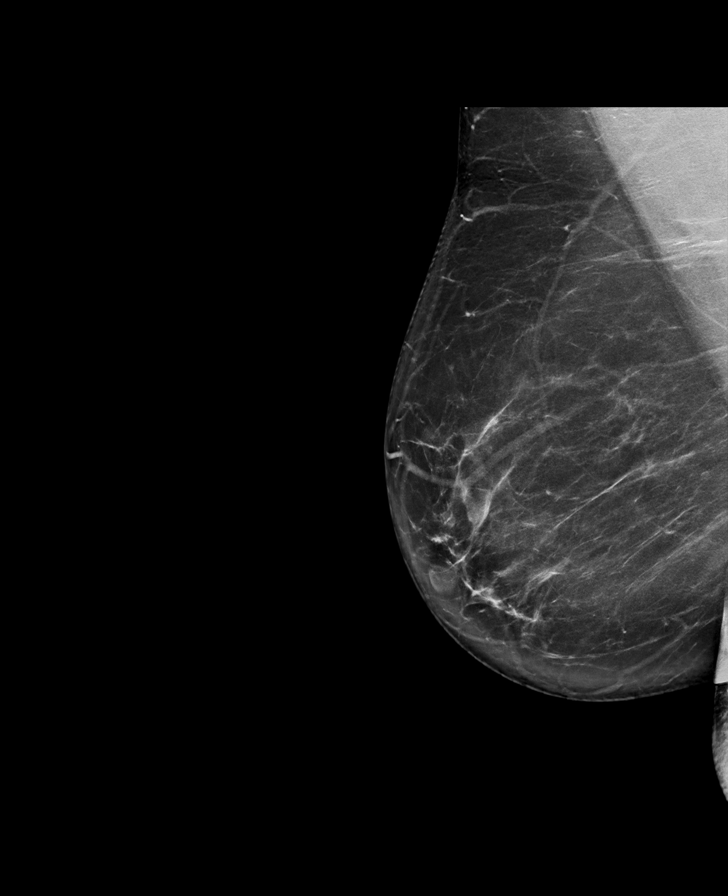

[R CC synth-2D]
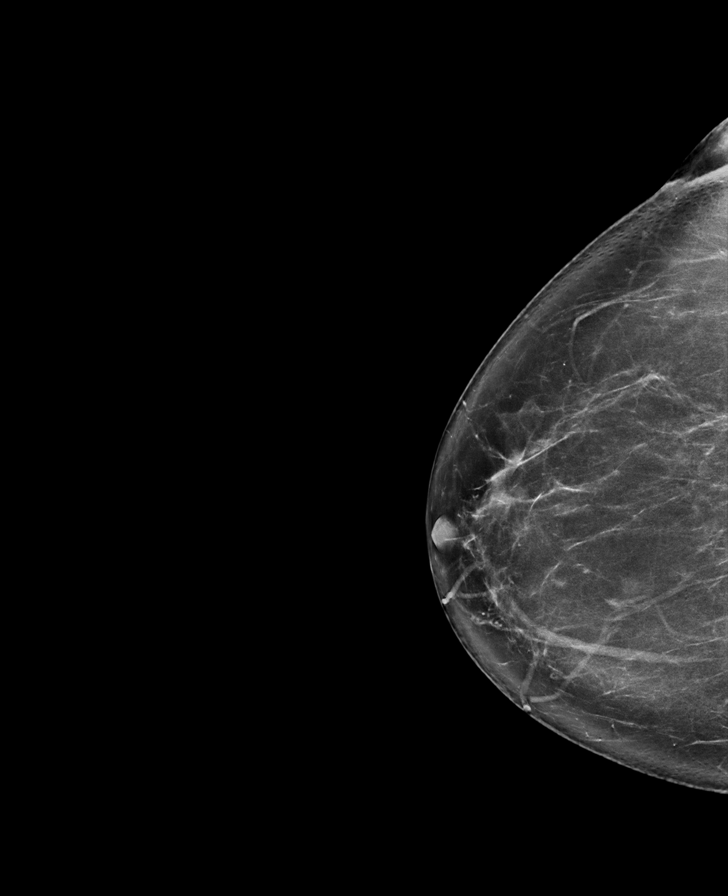

[L MLO tomo · tomo slice 50/99.0]
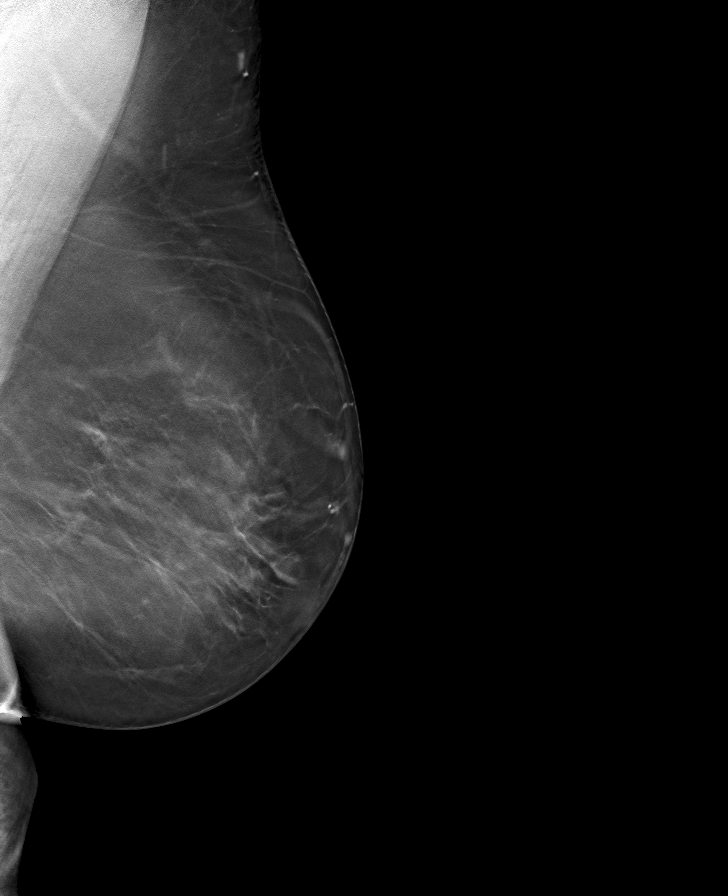

[R CC tomo · tomo slice 44/87.0]
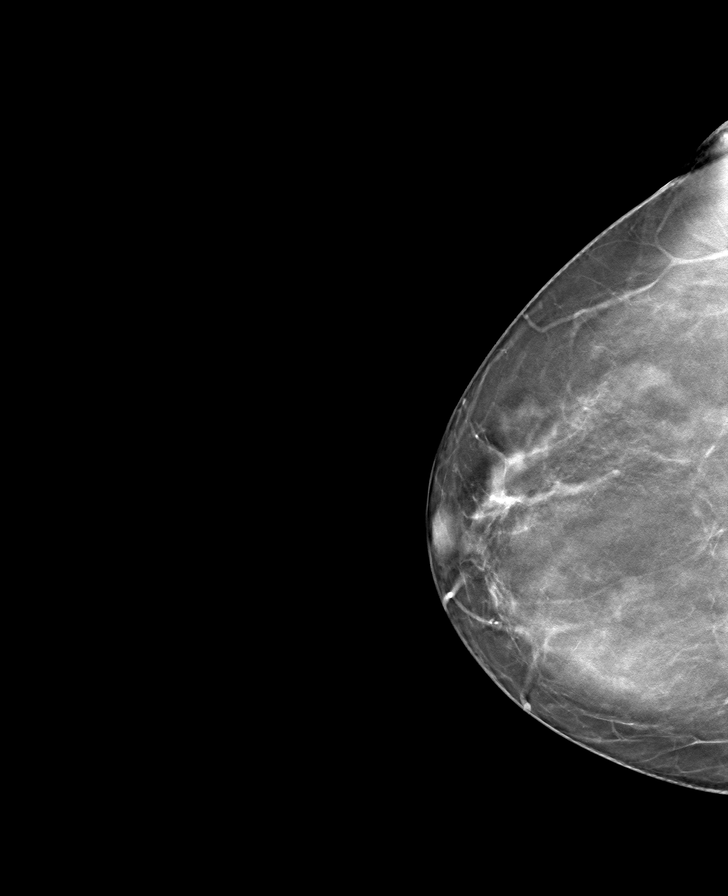

[R MLO tomo · tomo slice 47/92.0]
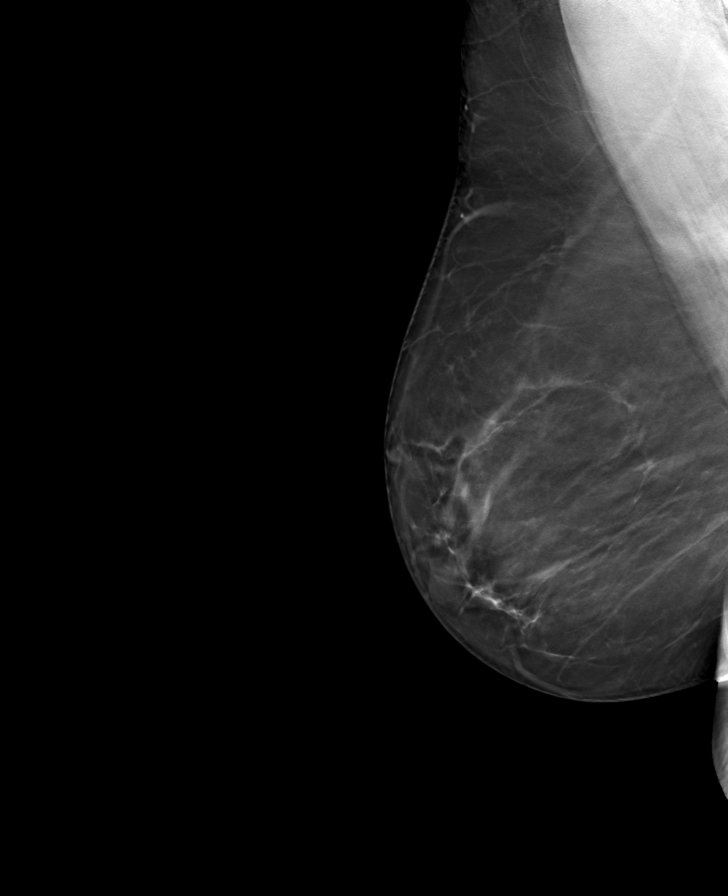

[L CC tomo · tomo slice 46/91.0]
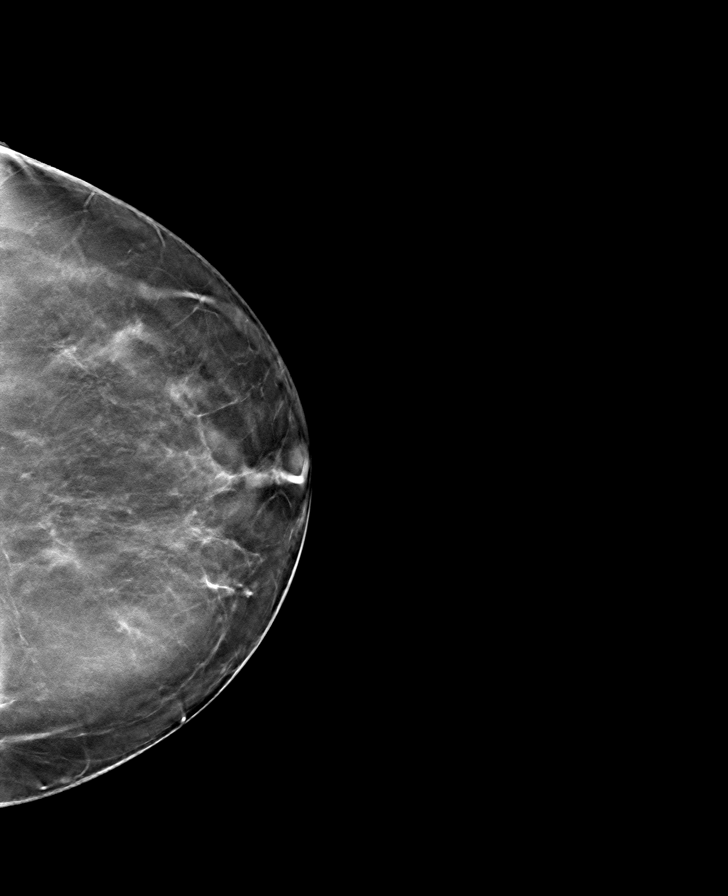

[8 of 24 positions shown; findings below may reference images not displayed]

ACR Breast Density Category b: There are scattered areas of
fibroglandular density.
FINDINGS: There are no findings suspicious for malignancy. Images were
processed with CAD.
IMPRESSION: No mammographic evidence of malignancy. A result letter of this
screening mammogram will be mailed directly to the patient.

RECOMMENDATION:
Screening mammogram in one year. (Code:[id number])

BI-RADS CATEGORY  1: Negative.

## 2021-10-24 DIAGNOSIS — E039 Hypothyroidism, unspecified: Secondary | ICD-10-CM | POA: Diagnosis not present

## 2021-10-24 DIAGNOSIS — E782 Mixed hyperlipidemia: Secondary | ICD-10-CM | POA: Diagnosis not present

## 2021-10-24 DIAGNOSIS — G5603 Carpal tunnel syndrome, bilateral upper limbs: Secondary | ICD-10-CM | POA: Diagnosis not present

## 2021-10-24 DIAGNOSIS — Z0000 Encounter for general adult medical examination without abnormal findings: Secondary | ICD-10-CM | POA: Diagnosis not present

## 2021-10-24 DIAGNOSIS — N3281 Overactive bladder: Secondary | ICD-10-CM | POA: Diagnosis not present

## 2021-11-11 ENCOUNTER — Other Ambulatory Visit: Payer: Self-pay | Admitting: Family Medicine

## 2021-11-30 DIAGNOSIS — E669 Obesity, unspecified: Secondary | ICD-10-CM | POA: Diagnosis not present

## 2021-11-30 DIAGNOSIS — Z6830 Body mass index (BMI) 30.0-30.9, adult: Secondary | ICD-10-CM | POA: Diagnosis not present

## 2022-01-23 DIAGNOSIS — Z6832 Body mass index (BMI) 32.0-32.9, adult: Secondary | ICD-10-CM | POA: Diagnosis not present

## 2022-01-23 DIAGNOSIS — E782 Mixed hyperlipidemia: Secondary | ICD-10-CM | POA: Diagnosis not present

## 2022-01-23 DIAGNOSIS — E6609 Other obesity due to excess calories: Secondary | ICD-10-CM | POA: Diagnosis not present

## 2022-02-07 ENCOUNTER — Other Ambulatory Visit: Payer: Self-pay | Admitting: Family Medicine

## 2022-02-07 DIAGNOSIS — Z1231 Encounter for screening mammogram for malignant neoplasm of breast: Secondary | ICD-10-CM

## 2022-02-15 ENCOUNTER — Ambulatory Visit
Admission: RE | Admit: 2022-02-15 | Discharge: 2022-02-15 | Disposition: A | Discharge status: Home / Self Care | Payer: BC Managed Care – PPO | Source: Ambulatory Visit | Attending: Family Medicine | Admitting: Family Medicine

## 2022-02-15 DIAGNOSIS — Z1231 Encounter for screening mammogram for malignant neoplasm of breast: Secondary | ICD-10-CM | POA: Diagnosis not present

## 2022-03-02 DIAGNOSIS — D2271 Melanocytic nevi of right lower limb, including hip: Secondary | ICD-10-CM | POA: Diagnosis not present

## 2022-03-02 DIAGNOSIS — L578 Other skin changes due to chronic exposure to nonionizing radiation: Secondary | ICD-10-CM | POA: Diagnosis not present

## 2022-03-02 DIAGNOSIS — L814 Other melanin hyperpigmentation: Secondary | ICD-10-CM | POA: Diagnosis not present

## 2022-03-02 DIAGNOSIS — D225 Melanocytic nevi of trunk: Secondary | ICD-10-CM | POA: Diagnosis not present

## 2022-03-06 DIAGNOSIS — E6609 Other obesity due to excess calories: Secondary | ICD-10-CM | POA: Diagnosis not present

## 2022-03-06 DIAGNOSIS — E782 Mixed hyperlipidemia: Secondary | ICD-10-CM | POA: Diagnosis not present

## 2022-03-06 DIAGNOSIS — E039 Hypothyroidism, unspecified: Secondary | ICD-10-CM | POA: Diagnosis not present

## 2022-03-06 DIAGNOSIS — Z6832 Body mass index (BMI) 32.0-32.9, adult: Secondary | ICD-10-CM | POA: Diagnosis not present

## 2022-04-26 DIAGNOSIS — R5383 Other fatigue: Secondary | ICD-10-CM | POA: Diagnosis not present

## 2022-04-26 DIAGNOSIS — E039 Hypothyroidism, unspecified: Secondary | ICD-10-CM | POA: Diagnosis not present

## 2022-04-26 DIAGNOSIS — E782 Mixed hyperlipidemia: Secondary | ICD-10-CM | POA: Diagnosis not present

## 2022-04-26 DIAGNOSIS — F419 Anxiety disorder, unspecified: Secondary | ICD-10-CM | POA: Diagnosis not present

## 2022-07-06 DIAGNOSIS — K219 Gastro-esophageal reflux disease without esophagitis: Secondary | ICD-10-CM | POA: Diagnosis not present

## 2022-07-06 DIAGNOSIS — R194 Change in bowel habit: Secondary | ICD-10-CM | POA: Diagnosis not present

## 2022-08-15 DIAGNOSIS — K219 Gastro-esophageal reflux disease without esophagitis: Secondary | ICD-10-CM | POA: Diagnosis not present

## 2022-08-15 DIAGNOSIS — R194 Change in bowel habit: Secondary | ICD-10-CM | POA: Diagnosis not present

## 2022-11-22 DIAGNOSIS — F419 Anxiety disorder, unspecified: Secondary | ICD-10-CM | POA: Diagnosis not present

## 2022-11-22 DIAGNOSIS — Z0000 Encounter for general adult medical examination without abnormal findings: Secondary | ICD-10-CM | POA: Diagnosis not present

## 2022-11-22 DIAGNOSIS — E782 Mixed hyperlipidemia: Secondary | ICD-10-CM | POA: Diagnosis not present

## 2022-11-22 DIAGNOSIS — E039 Hypothyroidism, unspecified: Secondary | ICD-10-CM | POA: Diagnosis not present

## 2022-11-22 DIAGNOSIS — Z124 Encounter for screening for malignant neoplasm of cervix: Secondary | ICD-10-CM | POA: Diagnosis not present

## 2022-11-22 DIAGNOSIS — K219 Gastro-esophageal reflux disease without esophagitis: Secondary | ICD-10-CM | POA: Diagnosis not present

## 2023-01-23 DIAGNOSIS — S0990XA Unspecified injury of head, initial encounter: Secondary | ICD-10-CM | POA: Diagnosis not present

## 2023-02-27 ENCOUNTER — Other Ambulatory Visit: Payer: Self-pay | Admitting: Family Medicine

## 2023-02-27 DIAGNOSIS — Z1231 Encounter for screening mammogram for malignant neoplasm of breast: Secondary | ICD-10-CM

## 2023-03-04 DIAGNOSIS — D225 Melanocytic nevi of trunk: Secondary | ICD-10-CM | POA: Diagnosis not present

## 2023-03-04 DIAGNOSIS — L820 Inflamed seborrheic keratosis: Secondary | ICD-10-CM | POA: Diagnosis not present

## 2023-03-04 DIAGNOSIS — L578 Other skin changes due to chronic exposure to nonionizing radiation: Secondary | ICD-10-CM | POA: Diagnosis not present

## 2023-03-04 DIAGNOSIS — L814 Other melanin hyperpigmentation: Secondary | ICD-10-CM | POA: Diagnosis not present

## 2023-03-04 DIAGNOSIS — D2271 Melanocytic nevi of right lower limb, including hip: Secondary | ICD-10-CM | POA: Diagnosis not present

## 2023-03-20 ENCOUNTER — Ambulatory Visit
Admission: RE | Admit: 2023-03-20 | Discharge: 2023-03-20 | Disposition: A | Discharge status: Home / Self Care | Payer: BC Managed Care – PPO | Source: Ambulatory Visit

## 2023-03-20 DIAGNOSIS — Z1231 Encounter for screening mammogram for malignant neoplasm of breast: Secondary | ICD-10-CM

## 2023-06-04 DIAGNOSIS — Z23 Encounter for immunization: Secondary | ICD-10-CM | POA: Diagnosis not present

## 2023-06-04 DIAGNOSIS — R030 Elevated blood-pressure reading, without diagnosis of hypertension: Secondary | ICD-10-CM | POA: Diagnosis not present

## 2023-06-04 DIAGNOSIS — E669 Obesity, unspecified: Secondary | ICD-10-CM | POA: Diagnosis not present

## 2023-06-04 DIAGNOSIS — E782 Mixed hyperlipidemia: Secondary | ICD-10-CM | POA: Diagnosis not present

## 2023-06-04 DIAGNOSIS — Z6833 Body mass index (BMI) 33.0-33.9, adult: Secondary | ICD-10-CM | POA: Diagnosis not present

## 2023-06-04 DIAGNOSIS — F419 Anxiety disorder, unspecified: Secondary | ICD-10-CM | POA: Diagnosis not present

## 2023-06-04 DIAGNOSIS — E039 Hypothyroidism, unspecified: Secondary | ICD-10-CM | POA: Diagnosis not present

## 2023-12-04 DIAGNOSIS — F419 Anxiety disorder, unspecified: Secondary | ICD-10-CM | POA: Diagnosis not present

## 2023-12-04 DIAGNOSIS — E039 Hypothyroidism, unspecified: Secondary | ICD-10-CM | POA: Diagnosis not present

## 2023-12-04 DIAGNOSIS — Z0000 Encounter for general adult medical examination without abnormal findings: Secondary | ICD-10-CM | POA: Diagnosis not present

## 2023-12-04 DIAGNOSIS — E782 Mixed hyperlipidemia: Secondary | ICD-10-CM | POA: Diagnosis not present

## 2023-12-04 DIAGNOSIS — N3281 Overactive bladder: Secondary | ICD-10-CM | POA: Diagnosis not present

## 2024-02-05 ENCOUNTER — Encounter: Payer: Self-pay | Admitting: Emergency Medicine

## 2024-02-05 ENCOUNTER — Ambulatory Visit
Admission: EM | Admit: 2024-02-05 | Discharge: 2024-02-05 | Disposition: A | Discharge status: Home / Self Care | Attending: Family Medicine | Admitting: Family Medicine

## 2024-02-05 DIAGNOSIS — R200 Anesthesia of skin: Secondary | ICD-10-CM

## 2024-02-05 DIAGNOSIS — R079 Chest pain, unspecified: Secondary | ICD-10-CM | POA: Diagnosis not present

## 2024-02-05 DIAGNOSIS — Z566 Other physical and mental strain related to work: Secondary | ICD-10-CM | POA: Diagnosis not present

## 2024-02-05 DIAGNOSIS — R202 Paresthesia of skin: Secondary | ICD-10-CM | POA: Diagnosis not present

## 2024-02-05 NOTE — Discharge Instructions (Addendum)
 As we discussed, EKG today looks great without any signs of acute heart strain.  I suspect the chest pain may be coming from the stress that you were describing at work.  Recommend taking the day off of work and relaxing at home.  You can take the alprazolam  when you get home if you feel like you need it.    Seek care in ER if you develop constant chest pain, shortness of breath, heart racing, difficulty breathing, lightheadedness or dizziness, or worsening of chest pain symptoms.

## 2024-02-05 NOTE — ED Provider Notes (Signed)
 [facility]    CSN: [id number] Arrival date & time: 02/05/24  1020      History   Chief Complaint Chief Complaint  Patient presents with   Chest Pain    HPI Audrey Rogers is a 58 y.o. female.   Patient presents today with intermittent left sided chest pain that began yesterday while at work.  She reports the pain is aching under her left breast and moving around to her back a little bit.  The pain comes and goes and lasts for a few minutes when it starts.  She can feel the pain coming on slowly, this morning on the way to work the pain recurred.  No recent fall, injury, or trauma to the chest.  Pain is not worse with exertion.  She went hiking a few days ago and did not have any chest pain during the hike or immediately after.  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  She became concerned about the chest pain this morning when she woke up and noticed both of her hands are intermittently numb/tingly as well.  She reports this has been going on for a while and when she "shakes them out" the numbness/tingling improves.  She thinks she may have carpal tunnel syndrome.    Patient denies history of heart attack or stroke under the age of 53 in any immediate family members.  Reports she follows with PCP regularly and last 6 month check showed normal labs including cholesterol.  Reports her mother had multiple "mini strokes" in her 41s and her father had heart disease prior to his passing.  Patient takes sertraline  50 mg daily and alprazolam  as needed for anxiety which typically helps keep anxiety and depression under control.  No recent changes in medications for anxiety/depression.     Past Medical History:  Diagnosis Date   Anxiety    Depression    History of  chicken pox    History of diverticulosis 10/15/2014   Per Colonoscopy   History of fibrocystic disease of breast    History of hemorrhoids    Hyperlipidemia    Hypertension    Hypothyroidism     Patient Active Problem List   Diagnosis Date Noted   Anxiety and depression 06/18/2018   Hypothyroidism 06/18/2018   HLD (hyperlipidemia) 06/18/2018   Insomnia 06/18/2018   Snoring 06/18/2018    Past Surgical History:  Procedure Laterality Date   ANAL FISSURE REPAIR  01/2010   Dr. Toniann Franklin   COLONOSCOPY  10/15/2014   Diverticulosis/repeat 10 years/[facility] Dr. Stann Earnest.    OB History     Gravida  1   Para  1   Term      Preterm      AB      Living  1      SAB      IAB      Ectopic      Multiple      Live Births  1            Home Medications    Prior to Admission medications   Medication Sig Start Date End Date Taking? Authorizing Provider  ALPRAZolam  (XANAX ) 0.5 MG tablet Take 1 tablet (0.5 mg total) by mouth at bedtime as needed for anxiety. 09/17/19   Aron,  Talia M, MD  Probiotic Product (PROBIOTIC DAILY PO) Take by mouth.    [provider]  sertraline  (ZOLOFT ) 50 MG tablet TAKE 1 TABLET BY MOUTH EVERY DAY 05/25/20   Tonna Frederic, MD  SYNTHROID  88 MCG tablet TAKE 1 TABLET BY MOUTH DAILY 05/29/20   Tonna Frederic, MD  traZODone  (DESYREL ) 50 MG tablet Take 0.5-1 tablets (25-50 mg total) by mouth at bedtime as needed for sleep. 06/18/18   Laveta Pottier, MD    Family History Family History  Problem Relation Age of Onset   Diabetes Mother    Hypertension Mother    Stroke Mother    Hypercholesterolemia Father    Hypertension Father    Heart disease Father    Hypertension Brother    Hypercholesterolemia Brother    Heart disease Brother    Stroke Maternal Grandmother    Hypertension Maternal Grandmother    Diabetes Maternal Grandmother    Stroke Maternal Grandfather    Hypertension Maternal Grandfather    Diabetes  Maternal Grandfather     Social History Social History   Tobacco Use   Smoking status: Never   Smokeless tobacco: Never     Allergies   Sulfa antibiotics   Review of Systems Review of Systems Per HPI  Physical Exam Triage Vital Signs ED Triage Vitals  Encounter Vitals Group     BP 02/05/24 1028 (!) 166/91     Systolic BP Percentile --      Diastolic BP Percentile --      Pulse Rate 02/05/24 1026 80     Resp 02/05/24 1026 17     Temp 02/05/24 1028 97.7 F (36.5 C)     Temp Source 02/05/24 1026 Oral     SpO2 02/05/24 1026 97 %     Weight --      Height --      Head Circumference --      Peak Flow --      Pain Score 02/05/24 1028 8     Pain Loc --      Pain Education --      Exclude from Growth Chart --    No data found.  Updated Vital Signs BP (!) 166/91 (BP Location: Right Arm)   Pulse 80   Temp 97.7 F (36.5 C)   Resp 17   SpO2 97%   Visual Acuity Right Eye Distance:   Left Eye Distance:   Bilateral Distance:    Right Eye Near:   Left Eye Near:    Bilateral Near:     Physical Exam Vitals and nursing note reviewed.  Constitutional:      General: She is not in acute distress.    Appearance: She is well-developed. She is not toxic-appearing or diaphoretic.  HENT:     Head: Normocephalic and atraumatic.     Right Ear: External ear normal.     Left Ear: External ear normal.     Mouth/Throat:     Mouth: Mucous membranes are moist.     Pharynx: Oropharynx is clear.  Eyes:     General:        Right eye: No discharge.        Left eye: No discharge.     Extraocular Movements: Extraocular movements intact.     Pupils: Pupils are equal, round, and reactive to light.  Neck:     Vascular: No carotid bruit.  Cardiovascular:     Rate and Rhythm: Normal rate.  Pulses: Normal pulses.     Heart sounds: Normal heart sounds. No murmur heard. Chest:     Chest wall: No swelling or tenderness.  Musculoskeletal:     Cervical back: Normal range of  motion.  Skin:    General: Skin is warm and dry.     Capillary Refill: Capillary refill takes less than 2 seconds.     Coloration: Skin is not jaundiced or pale.     Findings: No erythema.  Neurological:     Mental Status: She is alert and oriented to person, place, and time.  Psychiatric:        Mood and Affect: Mood is anxious.        Behavior: Behavior is cooperative.      UC Treatments / Results  Labs (all labs ordered are listed, but only abnormal results are displayed) Labs Reviewed - No data to display  EKG   Radiology No results found.  Procedures Procedures (including critical care time)  Medications Ordered in UC Medications - No data to display  Initial Impression / Assessment and Plan / UC Course  I have reviewed the triage vital signs and the nursing notes.  Pertinent labs & imaging results that were available during my care of the patient were reviewed by me and considered in my medical decision making (see chart for details).   Patient is mildly hypertensive in triage today, otherwise vital signs are stable.  1. Chest pain, unspecified type 2. Stress at work 3. Numbness and tingling in both hands EKG today shows sinus rhythm with short PR; no ST segment or T wave abnormalities.  There is no previous EKG for comparison Patient is mildly hypertensive, otherwise she is well-appearing and vitals are stable Her chest pain does not sound cardiac in etiology, although we discussed I cannot rule out cardiac involvement and if she desires that reassurance or if her pain worsens, she is to seek care in the emergency room Suspect numbness/tingling in hands is likely due to carpal tunnel syndrome as this has been going on for quite some time and is not new since the chest pain started In meantime, recommended relaxation techniques and taking "rescue" anxiety medicine as prescribed by her primary care provider She can also make a follow-up visit with her primary care  provider if symptoms persist Note was given for work for today Strict ER precautions discussed with patient at length  The patient was given the opportunity to ask questions.  All questions answered to their satisfaction.  The patient is in agreement to this plan.   Final Clinical Impressions(s) / UC Diagnoses   Final diagnoses:  Chest pain, unspecified type  Stress at work  Numbness and tingling in both hands     Discharge Instructions      As we discussed, EKG today looks great without any signs of acute heart strain.  I suspect the chest pain may be coming from the stress that you were describing at work.  Recommend taking the day off of work and relaxing at home.  You can take the alprazolam  when you get home if you feel like you need it.    Seek care in ER if you develop constant chest pain, shortness of breath, heart racing, difficulty breathing, lightheadedness or dizziness, or worsening of chest pain symptoms.  ED Prescriptions   None    PDMP not reviewed this encounter.   Wilhemena Harbour, NP 02/05/24 1240

## 2024-02-05 NOTE — ED Triage Notes (Signed)
 Pt c/o chest pain 8/10, left side pain, and tingling in hands for 1 day

## 2024-03-05 DIAGNOSIS — D2271 Melanocytic nevi of right lower limb, including hip: Secondary | ICD-10-CM | POA: Diagnosis not present

## 2024-03-05 DIAGNOSIS — L814 Other melanin hyperpigmentation: Secondary | ICD-10-CM | POA: Diagnosis not present

## 2024-03-05 DIAGNOSIS — L578 Other skin changes due to chronic exposure to nonionizing radiation: Secondary | ICD-10-CM | POA: Diagnosis not present

## 2024-03-05 DIAGNOSIS — L820 Inflamed seborrheic keratosis: Secondary | ICD-10-CM | POA: Diagnosis not present

## 2024-03-05 DIAGNOSIS — D225 Melanocytic nevi of trunk: Secondary | ICD-10-CM | POA: Diagnosis not present

## 2024-03-26 ENCOUNTER — Other Ambulatory Visit: Payer: Self-pay | Admitting: Family Medicine

## 2024-03-26 DIAGNOSIS — Z1231 Encounter for screening mammogram for malignant neoplasm of breast: Secondary | ICD-10-CM

## 2024-04-09 ENCOUNTER — Ambulatory Visit
Admission: RE | Admit: 2024-04-09 | Discharge: 2024-04-09 | Disposition: A | Discharge status: Home / Self Care | Source: Ambulatory Visit | Attending: Family Medicine | Admitting: Family Medicine

## 2024-04-09 DIAGNOSIS — Z1231 Encounter for screening mammogram for malignant neoplasm of breast: Secondary | ICD-10-CM

## 2024-05-07 DIAGNOSIS — B372 Candidiasis of skin and nail: Secondary | ICD-10-CM | POA: Diagnosis not present

## 2024-05-07 DIAGNOSIS — H4389 Other disorders of vitreous body: Secondary | ICD-10-CM | POA: Diagnosis not present

## 2024-05-12 DIAGNOSIS — L309 Dermatitis, unspecified: Secondary | ICD-10-CM | POA: Diagnosis not present

## 2024-05-13 DIAGNOSIS — H11442 Conjunctival cysts, left eye: Secondary | ICD-10-CM | POA: Diagnosis not present

## 2024-06-04 DIAGNOSIS — K219 Gastro-esophageal reflux disease without esophagitis: Secondary | ICD-10-CM | POA: Diagnosis not present

## 2024-06-04 DIAGNOSIS — Z23 Encounter for immunization: Secondary | ICD-10-CM | POA: Diagnosis not present

## 2024-06-04 DIAGNOSIS — E782 Mixed hyperlipidemia: Secondary | ICD-10-CM | POA: Diagnosis not present

## 2024-06-04 DIAGNOSIS — F419 Anxiety disorder, unspecified: Secondary | ICD-10-CM | POA: Diagnosis not present

## 2024-06-04 DIAGNOSIS — R7303 Prediabetes: Secondary | ICD-10-CM | POA: Diagnosis not present

## 2024-06-04 DIAGNOSIS — R233 Spontaneous ecchymoses: Secondary | ICD-10-CM | POA: Diagnosis not present

## 2024-06-04 DIAGNOSIS — E039 Hypothyroidism, unspecified: Secondary | ICD-10-CM | POA: Diagnosis not present
